# Patient Record
Sex: Female | Born: 1970 | Race: White | Hispanic: No | Marital: Married | Smoking: Former smoker
Health system: Southern US, Community
[De-identification: ages and names within clinical notes are randomized; demographics above are authoritative.]

## PROBLEM LIST (undated history)

## (undated) VITALS — BP 126/86 | HR 96 | Temp 97.9°F | Resp 16 | Ht 63.0 in | Wt 132.0 lb

## (undated) DIAGNOSIS — F419 Anxiety disorder, unspecified: Secondary | ICD-10-CM

## (undated) DIAGNOSIS — N979 Female infertility, unspecified: Secondary | ICD-10-CM

## (undated) DIAGNOSIS — E079 Disorder of thyroid, unspecified: Secondary | ICD-10-CM

## (undated) DIAGNOSIS — R12 Heartburn: Secondary | ICD-10-CM

## (undated) DIAGNOSIS — J069 Acute upper respiratory infection, unspecified: Secondary | ICD-10-CM

## (undated) DIAGNOSIS — Z780 Asymptomatic menopausal state: Secondary | ICD-10-CM

## (undated) DIAGNOSIS — F32A Depression, unspecified: Secondary | ICD-10-CM

## (undated) DIAGNOSIS — M2550 Pain in unspecified joint: Secondary | ICD-10-CM

## (undated) DIAGNOSIS — R600 Localized edema: Secondary | ICD-10-CM

## (undated) DIAGNOSIS — F988 Other specified behavioral and emotional disorders with onset usually occurring in childhood and adolescence: Secondary | ICD-10-CM

## (undated) DIAGNOSIS — K5900 Constipation, unspecified: Secondary | ICD-10-CM

## (undated) DIAGNOSIS — I10 Essential (primary) hypertension: Secondary | ICD-10-CM

## (undated) DIAGNOSIS — R0602 Shortness of breath: Secondary | ICD-10-CM

## (undated) DIAGNOSIS — Z8489 Family history of other specified conditions: Secondary | ICD-10-CM

## (undated) DIAGNOSIS — F329 Major depressive disorder, single episode, unspecified: Secondary | ICD-10-CM

## (undated) DIAGNOSIS — M549 Dorsalgia, unspecified: Secondary | ICD-10-CM

## (undated) DIAGNOSIS — K829 Disease of gallbladder, unspecified: Secondary | ICD-10-CM

## (undated) HISTORY — DX: Essential (primary) hypertension: I10

## (undated) HISTORY — DX: Localized edema: R60.0

## (undated) HISTORY — DX: Female infertility, unspecified: N97.9

## (undated) HISTORY — DX: Disease of gallbladder, unspecified: K82.9

## (undated) HISTORY — DX: Asymptomatic menopausal state: Z78.0

## (undated) HISTORY — DX: Other specified behavioral and emotional disorders with onset usually occurring in childhood and adolescence: F98.8

## (undated) HISTORY — PX: UTERINE FIBROID SURGERY: SHX826

## (undated) HISTORY — PX: TONSILLECTOMY: SUR1361

## (undated) HISTORY — DX: Disorder of thyroid, unspecified: E07.9

## (undated) HISTORY — DX: Shortness of breath: R06.02

## (undated) HISTORY — DX: Dorsalgia, unspecified: M54.9

## (undated) HISTORY — DX: Heartburn: R12

## (undated) HISTORY — DX: Pain in unspecified joint: M25.50

## (undated) HISTORY — DX: Constipation, unspecified: K59.00

---

## 1998-04-27 ENCOUNTER — Other Ambulatory Visit: Admission: RE | Admit: 1998-04-27 | Discharge: 1998-04-27 | Payer: Self-pay | Admitting: *Deleted

## 1998-09-18 ENCOUNTER — Other Ambulatory Visit: Admission: RE | Admit: 1998-09-18 | Discharge: 1998-09-18 | Payer: Self-pay | Admitting: Obstetrics and Gynecology

## 1999-10-03 ENCOUNTER — Other Ambulatory Visit: Admission: RE | Admit: 1999-10-03 | Discharge: 1999-10-03 | Payer: Self-pay | Admitting: Obstetrics and Gynecology

## 2000-12-28 ENCOUNTER — Other Ambulatory Visit: Admission: RE | Admit: 2000-12-28 | Discharge: 2000-12-28 | Payer: Self-pay | Admitting: Obstetrics and Gynecology

## 2002-04-05 ENCOUNTER — Other Ambulatory Visit: Admission: RE | Admit: 2002-04-05 | Discharge: 2002-04-05 | Payer: Self-pay | Admitting: *Deleted

## 2003-05-05 ENCOUNTER — Other Ambulatory Visit: Admission: RE | Admit: 2003-05-05 | Discharge: 2003-05-05 | Payer: Self-pay | Admitting: Obstetrics and Gynecology

## 2003-12-26 ENCOUNTER — Other Ambulatory Visit: Admission: RE | Admit: 2003-12-26 | Discharge: 2003-12-26 | Payer: Self-pay | Admitting: Obstetrics and Gynecology

## 2004-05-07 ENCOUNTER — Other Ambulatory Visit: Admission: RE | Admit: 2004-05-07 | Discharge: 2004-05-07 | Payer: Self-pay | Admitting: Obstetrics and Gynecology

## 2005-05-26 ENCOUNTER — Other Ambulatory Visit: Admission: RE | Admit: 2005-05-26 | Discharge: 2005-05-26 | Payer: Self-pay | Admitting: Obstetrics and Gynecology

## 2005-07-04 ENCOUNTER — Encounter: Admission: RE | Admit: 2005-07-04 | Discharge: 2005-07-04 | Payer: Self-pay | Admitting: Family Medicine

## 2010-07-02 ENCOUNTER — Encounter: Admission: RE | Admit: 2010-07-02 | Discharge: 2010-07-02 | Payer: Self-pay | Admitting: Obstetrics & Gynecology

## 2011-02-17 ENCOUNTER — Emergency Department: Payer: Self-pay | Admitting: Emergency Medicine

## 2011-05-05 ENCOUNTER — Other Ambulatory Visit: Payer: Self-pay | Admitting: Family Medicine

## 2011-05-05 DIAGNOSIS — R3915 Urgency of urination: Secondary | ICD-10-CM

## 2011-05-06 ENCOUNTER — Ambulatory Visit
Admission: RE | Admit: 2011-05-06 | Discharge: 2011-05-06 | Disposition: A | Discharge status: Home / Self Care | Payer: 59 | Source: Ambulatory Visit | Attending: Family Medicine | Admitting: Family Medicine

## 2011-05-06 DIAGNOSIS — R3915 Urgency of urination: Secondary | ICD-10-CM

## 2011-05-16 ENCOUNTER — Encounter (HOSPITAL_COMMUNITY): Payer: Self-pay

## 2011-05-16 ENCOUNTER — Encounter (HOSPITAL_COMMUNITY)
Admission: RE | Admit: 2011-05-16 | Discharge: 2011-05-16 | Disposition: A | Discharge status: Home / Self Care | Payer: 59 | Source: Ambulatory Visit | Attending: Obstetrics & Gynecology | Admitting: Obstetrics & Gynecology

## 2011-05-16 HISTORY — DX: Major depressive disorder, single episode, unspecified: F32.9

## 2011-05-16 HISTORY — DX: Anxiety disorder, unspecified: F41.9

## 2011-05-16 HISTORY — DX: Acute upper respiratory infection, unspecified: J06.9

## 2011-05-16 HISTORY — DX: Depression, unspecified: F32.A

## 2011-05-16 LAB — CBC
HCT: 40.6 % (ref 36.0–46.0)
Hemoglobin: 13.9 g/dL (ref 12.0–15.0)
MCH: 32.5 pg (ref 26.0–34.0)
MCHC: 34.2 g/dL (ref 30.0–36.0)
MCV: 94.9 fL (ref 78.0–100.0)
Platelets: 318 10*3/uL (ref 150–400)
RBC: 4.28 MIL/uL (ref 3.87–5.11)
RDW: 12.8 % (ref 11.5–15.5)
WBC: 8.5 10*3/uL (ref 4.0–10.5)

## 2011-05-16 LAB — SURGICAL PCR SCREEN
MRSA, PCR: NEGATIVE
Staphylococcus aureus: NEGATIVE

## 2011-05-16 NOTE — Patient Instructions (Signed)
20 Ledell Noss  05/16/2011   Your procedure is scheduled on:  05/21/11  Report to [facility] at 11 AM...Marland KitchenMarland KitchenMain entrance  Call this number if you have problems the morning of surgery: [phone]   Remember:   Do not eat food:After Midnight.  Do not drink clear liquids: 4 Hours before arrival.  Take these medicines the morning of surgery with A SIP OF WATER: none   Do not wear jewelry, make-up or nail polish.  Do not bring valuables to the hospital.  Contacts, dentures or bridgework may not be worn into surgery.  Leave suitcase in the car. After surgery it may be brought to your room.  For patients admitted to the hospital, checkout time is 11:00 AM the day of discharge.   Patients discharged the day of surgery will not be allowed to drive home.  Name and phone number of your driver: Tracey Harries-  Special Instructions: shower x 2 with HIbiclens   Please read over the following fact sheets that you were given: none

## 2011-05-21 ENCOUNTER — Encounter (HOSPITAL_COMMUNITY): Payer: Self-pay | Admitting: Anesthesiology

## 2011-05-21 ENCOUNTER — Encounter (HOSPITAL_COMMUNITY)
Admission: RE | Disposition: A | Discharge status: Home / Self Care | Payer: Self-pay | Source: Ambulatory Visit | Attending: Obstetrics & Gynecology

## 2011-05-21 ENCOUNTER — Ambulatory Visit (HOSPITAL_COMMUNITY)
Admission: RE | Admit: 2011-05-21 | Discharge: 2011-05-22 | Disposition: A | Discharge status: Home / Self Care | Payer: 59 | Source: Ambulatory Visit | Attending: Obstetrics & Gynecology | Admitting: Obstetrics & Gynecology

## 2011-05-21 ENCOUNTER — Ambulatory Visit (HOSPITAL_COMMUNITY): Payer: 59 | Admitting: Anesthesiology

## 2011-05-21 ENCOUNTER — Other Ambulatory Visit: Payer: Self-pay | Admitting: Obstetrics & Gynecology

## 2011-05-21 DIAGNOSIS — D251 Intramural leiomyoma of uterus: Secondary | ICD-10-CM | POA: Insufficient documentation

## 2011-05-21 DIAGNOSIS — Z01812 Encounter for preprocedural laboratory examination: Secondary | ICD-10-CM | POA: Insufficient documentation

## 2011-05-21 DIAGNOSIS — Z01818 Encounter for other preprocedural examination: Secondary | ICD-10-CM | POA: Insufficient documentation

## 2011-05-21 HISTORY — PX: ROBOT ASSISTED MYOMECTOMY: SHX5142

## 2011-05-21 LAB — ABO/RH: ABO/RH(D): O POS

## 2011-05-21 LAB — TYPE AND SCREEN
ABO/RH(D): O POS
Antibody Screen: NEGATIVE

## 2011-05-21 LAB — PREGNANCY, URINE: Preg Test, Ur: NEGATIVE

## 2011-05-21 SURGERY — ROBOTIC ASSISTED MYOMECTOMY
Anesthesia: General | Site: Uterus | Wound class: Clean Contaminated

## 2011-05-21 MED ORDER — KETOROLAC TROMETHAMINE 60 MG/2ML IM SOLN
INTRAMUSCULAR | Status: DC | PRN
  Administered 2011-05-21: 30 mg via INTRAMUSCULAR

## 2011-05-21 MED ORDER — DEXTROSE 5 % IV SOLN: 1.0000 g | INTRAVENOUS | Status: DC

## 2011-05-21 MED ORDER — LIDOCAINE HCL (CARDIAC) 10 MG/ML IV SOLN
INTRAVENOUS | Status: DC | PRN
  Administered 2011-05-21: 80 mg via INTRAVENOUS

## 2011-05-21 MED ORDER — ONDANSETRON HCL 4 MG/2ML IJ SOLN
INTRAMUSCULAR | Status: AC
  Filled 2011-05-21: qty 2

## 2011-05-21 MED ORDER — HYDROMORPHONE HCL 1 MG/ML IJ SOLN
0.2000 mg | INTRAMUSCULAR | Status: DC | PRN
  Administered 2011-05-21 – 2011-05-22 (×2): 0.6 mg via INTRAVENOUS
  Filled 2011-05-21 (×2): qty 1

## 2011-05-21 MED ORDER — MIDAZOLAM HCL 2 MG/2ML IJ SOLN
INTRAMUSCULAR | Status: AC
  Filled 2011-05-21: qty 2

## 2011-05-21 MED ORDER — LIDOCAINE HCL (CARDIAC) 20 MG/ML IV SOLN
INTRAVENOUS | Status: AC
  Filled 2011-05-21: qty 5

## 2011-05-21 MED ORDER — GLYCOPYRROLATE 0.2 MG/ML IJ SOLN
INTRAMUSCULAR | Status: DC | PRN
  Administered 2011-05-21: .4 mg via INTRAVENOUS
  Administered 2011-05-21: .2 mg via INTRAVENOUS

## 2011-05-21 MED ORDER — FENTANYL CITRATE 0.05 MG/ML IJ SOLN
INTRAMUSCULAR | Status: AC
  Filled 2011-05-21: qty 2

## 2011-05-21 MED ORDER — FENTANYL CITRATE 0.05 MG/ML IJ SOLN
INTRAMUSCULAR | Status: DC | PRN
  Administered 2011-05-21 (×4): 50 ug via INTRAVENOUS
  Administered 2011-05-21: 100 ug via INTRAVENOUS
  Administered 2011-05-21: 50 ug via INTRAVENOUS

## 2011-05-21 MED ORDER — NEOSTIGMINE METHYLSULFATE 1 MG/ML IJ SOLN
INTRAMUSCULAR | Status: DC | PRN
  Administered 2011-05-21: 2.5 mg via INTRAMUSCULAR

## 2011-05-21 MED ORDER — KETOROLAC TROMETHAMINE 30 MG/ML IJ SOLN: 30.0000 mg | Freq: Four times a day (QID) | INTRAMUSCULAR | Status: DC

## 2011-05-21 MED ORDER — LORATADINE-PSEUDOEPHEDRINE ER 5-120 MG PO TB12: 1.0000 | ORAL_TABLET | Freq: Two times a day (BID) | ORAL | Status: DC

## 2011-05-21 MED ORDER — MIDAZOLAM HCL 5 MG/5ML IJ SOLN
INTRAMUSCULAR | Status: DC | PRN
  Administered 2011-05-21: 2 mg via INTRAVENOUS

## 2011-05-21 MED ORDER — IBUPROFEN 600 MG PO TABS
600.0000 mg | ORAL_TABLET | Freq: Four times a day (QID) | ORAL | Status: DC | PRN
  Administered 2011-05-22: 600 mg via ORAL
  Filled 2011-05-21: qty 1

## 2011-05-21 MED ORDER — BUPIVACAINE HCL (PF) 0.25 % IJ SOLN
INTRAMUSCULAR | Status: DC | PRN
  Administered 2011-05-21: 14 mL

## 2011-05-21 MED ORDER — PROMETHAZINE HCL 25 MG/ML IJ SOLN: 12.5000 mg | INTRAMUSCULAR | Status: DC | PRN

## 2011-05-21 MED ORDER — HYDROMORPHONE HCL 1 MG/ML IJ SOLN
INTRAMUSCULAR | Status: DC | PRN
  Administered 2011-05-21: 1 mg via INTRAVENOUS

## 2011-05-21 MED ORDER — OXYCODONE-ACETAMINOPHEN 5-325 MG PO TABS: 1.0000 | ORAL_TABLET | ORAL | Status: DC | PRN

## 2011-05-21 MED ORDER — SIMETHICONE 80 MG PO CHEW: 80.0000 mg | CHEWABLE_TABLET | Freq: Four times a day (QID) | ORAL | Status: DC | PRN

## 2011-05-21 MED ORDER — ROCURONIUM BROMIDE 50 MG/5ML IV SOLN
INTRAVENOUS | Status: AC
  Filled 2011-05-21: qty 1

## 2011-05-21 MED ORDER — RINGERS IRRIGATION IR SOLN
Status: DC | PRN
  Administered 2011-05-21: 1

## 2011-05-21 MED ORDER — FENTANYL CITRATE 0.05 MG/ML IJ SOLN
INTRAMUSCULAR | Status: AC
  Filled 2011-05-21: qty 10

## 2011-05-21 MED ORDER — GLYCOPYRROLATE 0.2 MG/ML IJ SOLN
INTRAMUSCULAR | Status: AC
  Filled 2011-05-21: qty 1

## 2011-05-21 MED ORDER — ROCURONIUM BROMIDE 100 MG/10ML IV SOLN
INTRAVENOUS | Status: DC | PRN
  Administered 2011-05-21: 45 mg via INTRAVENOUS
  Administered 2011-05-21: 20 mg via INTRAVENOUS
  Administered 2011-05-21: 5 mg via INTRAVENOUS

## 2011-05-21 MED ORDER — LACTATED RINGERS IV SOLN
INTRAVENOUS | Status: DC
  Administered 2011-05-22: 02:00:00 via INTRAVENOUS

## 2011-05-21 MED ORDER — HYDROMORPHONE HCL 1 MG/ML IJ SOLN
0.5000 mg | INTRAMUSCULAR | Status: DC | PRN
  Administered 2011-05-21 (×2): 0.5 mg via INTRAVENOUS

## 2011-05-21 MED ORDER — HYDROMORPHONE HCL 1 MG/ML IJ SOLN
INTRAMUSCULAR | Status: AC
  Administered 2011-05-21: 0.5 mg via INTRAVENOUS
  Filled 2011-05-21: qty 1

## 2011-05-21 MED ORDER — KETOROLAC TROMETHAMINE 30 MG/ML IJ SOLN
30.0000 mg | Freq: Four times a day (QID) | INTRAMUSCULAR | Status: DC
  Administered 2011-05-21 – 2011-05-22 (×2): 30 mg via INTRAVENOUS
  Filled 2011-05-21 (×2): qty 1

## 2011-05-21 MED ORDER — DEXTROSE 5 % IV SOLN
1.0000 g | INTRAVENOUS | Status: DC
  Filled 2011-05-21: qty 1

## 2011-05-21 MED ORDER — DEXAMETHASONE SODIUM PHOSPHATE 10 MG/ML IJ SOLN
INTRAMUSCULAR | Status: DC | PRN
  Administered 2011-05-21: 10 mg via INTRAVENOUS

## 2011-05-21 MED ORDER — CITALOPRAM HYDROBROMIDE 20 MG PO TABS
20.0000 mg | ORAL_TABLET | Freq: Every day | ORAL | Status: DC
Start: 2011-05-21 — End: 2011-05-22
  Administered 2011-05-21: 20 mg via ORAL
  Filled 2011-05-21 (×3): qty 1

## 2011-05-21 MED ORDER — PROPOFOL 10 MG/ML IV EMUL
INTRAVENOUS | Status: DC | PRN
  Administered 2011-05-21: 150 mg via INTRAVENOUS

## 2011-05-21 MED ORDER — PSEUDOEPHEDRINE HCL ER 120 MG PO TB12
120.0000 mg | ORAL_TABLET | Freq: Two times a day (BID) | ORAL | Status: DC
  Filled 2011-05-21 (×4): qty 1

## 2011-05-21 MED ORDER — PROPOFOL 10 MG/ML IV EMUL
INTRAVENOUS | Status: AC
  Filled 2011-05-21: qty 20

## 2011-05-21 MED ORDER — KETOROLAC TROMETHAMINE 60 MG/2ML IM SOLN
INTRAMUSCULAR | Status: AC
  Filled 2011-05-21: qty 2

## 2011-05-21 MED ORDER — FENTANYL CITRATE 0.05 MG/ML IJ SOLN
INTRAMUSCULAR | Status: AC
  Administered 2011-05-21: 50 ug via INTRAVENOUS
  Filled 2011-05-21: qty 2

## 2011-05-21 MED ORDER — HYDROMORPHONE HCL 1 MG/ML IJ SOLN
INTRAMUSCULAR | Status: AC
  Filled 2011-05-21: qty 1

## 2011-05-21 MED ORDER — DEXAMETHASONE SODIUM PHOSPHATE 10 MG/ML IJ SOLN
INTRAMUSCULAR | Status: AC
  Filled 2011-05-21: qty 1

## 2011-05-21 MED ORDER — SUCCINYLCHOLINE CHLORIDE 20 MG/ML IJ SOLN
INTRAMUSCULAR | Status: DC | PRN
  Administered 2011-05-21: 20 mg via INTRAVENOUS

## 2011-05-21 MED ORDER — LORATADINE 10 MG PO TABS
10.0000 mg | ORAL_TABLET | Freq: Every day | ORAL | Status: DC
  Filled 2011-05-21 (×3): qty 1

## 2011-05-21 MED ORDER — LACTATED RINGERS IV SOLN
INTRAVENOUS | Status: DC
  Administered 2011-05-21 (×5): via INTRAVENOUS

## 2011-05-21 MED ORDER — VASOPRESSIN 20 UNIT/ML IJ SOLN
INTRAVENOUS | Status: DC | PRN
  Administered 2011-05-21: 16:00:00 via INTRAMUSCULAR

## 2011-05-21 MED ORDER — DEXTROSE 5 % IV SOLN
1.0000 g | INTRAVENOUS | Status: DC | PRN
  Administered 2011-05-21: 1 g via INTRAVENOUS

## 2011-05-21 MED ORDER — FENTANYL CITRATE 0.05 MG/ML IJ SOLN
25.0000 ug | INTRAMUSCULAR | Status: DC | PRN
  Administered 2011-05-21 (×2): 50 ug via INTRAVENOUS

## 2011-05-21 MED ORDER — ONDANSETRON HCL 4 MG/2ML IJ SOLN
INTRAMUSCULAR | Status: DC | PRN
  Administered 2011-05-21: 4 mg via INTRAVENOUS

## 2011-05-21 SURGICAL SUPPLY — 63 items
BARRIER ADHS 3X4 INTERCEED (GAUZE/BANDAGES/DRESSINGS) ×2 IMPLANT
BLADE LAPAROSCOPIC MORCELL KIT (BLADE) ×2 IMPLANT
BRR ADH 4X3 ABS CNTRL BYND (GAUZE/BANDAGES/DRESSINGS) ×1
CANNULA SEAL DVNC (CANNULA) ×3 IMPLANT
CANNULA SEALS DA VINCI (CANNULA) ×3
CHLORAPREP W/TINT 26ML (MISCELLANEOUS) ×2 IMPLANT
CLOTH BEACON ORANGE TIMEOUT ST (SAFETY) ×2 IMPLANT
DECANTER SPIKE VIAL GLASS SM (MISCELLANEOUS) ×2 IMPLANT
DERMABOND ADVANCED (GAUZE/BANDAGES/DRESSINGS) ×5 IMPLANT
DRAPE CAMERA ARM DA VINCI (DRAPE) ×2 IMPLANT
DRAPE CAMERA DA VINCI (DRAPES) ×2 IMPLANT
DRAPE HUG U DISPOSABLE (DRAPE) ×2 IMPLANT
DRAPE HYSTEROSCOPY (DRAPE) ×2 IMPLANT
DRAPE INSTRUMENT ARM DA VINCI (DRAPES) ×1
DRAPE INSTRUMENT ARM DVNC (DRAPES) ×1 IMPLANT
DRAPE LG THREE QUARTER DISP (DRAPES) ×4 IMPLANT
DRAPE MONITOR DA VINCI (DRAPE) ×2 IMPLANT
DRAPE UTILITY XL STRL (DRAPES) ×2 IMPLANT
DRAPE WARM FLUID 44X44 (DRAPE) ×2 IMPLANT
ELECT REM PT RETURN 9FT ADLT (ELECTROSURGICAL) ×2
ELECTRODE REM PT RTRN 9FT ADLT (ELECTROSURGICAL) ×1 IMPLANT
EVACUATOR SMOKE 8.L (FILTER) ×2 IMPLANT
GAUZE VASELINE 3X9 (GAUZE/BANDAGES/DRESSINGS) IMPLANT
GLOVE BIO SURGEON STRL SZ 6.5 (GLOVE) ×4 IMPLANT
GLOVE ECLIPSE 6.5 STRL STRAW (GLOVE) ×6 IMPLANT
GOWN BRE IMP SLV AUR LG STRL (GOWN DISPOSABLE) ×8 IMPLANT
IV SET ADMIN PUMP GEMINI W/NLD (IV SETS) IMPLANT
IV STOPCOCK 4 WAY 40  W/Y SET (IV SOLUTION) ×1
IV STOPCOCK 4 WAY 40 W/Y SET (IV SOLUTION) ×1 IMPLANT
KIT DISP ACCESSORY 4 ARM (KITS) ×2 IMPLANT
NDL INSUFFLATION 14GA 120MM (NEEDLE) ×1 IMPLANT
NDL SPNL 22GX7 QUINCKE BK (NEEDLE) IMPLANT
NEEDLE HYPO 22GX1.5 SAFETY (NEEDLE) ×2 IMPLANT
NEEDLE INSUFFLATION 14GA 120MM (NEEDLE) ×2 IMPLANT
NEEDLE SPNL 22GX7 QUINCKE BK (NEEDLE) ×2 IMPLANT
NS IRRIG 1000ML POUR BTL (IV SOLUTION) ×6 IMPLANT
OCCLUDER COLPOPNEUMO (BALLOONS) IMPLANT
PACK LAVH (CUSTOM PROCEDURE TRAY) ×2 IMPLANT
POSITIONER SURGICAL ARM (MISCELLANEOUS) ×4 IMPLANT
SET IRRIG TUBING LAPAROSCOPIC (IRRIGATION / IRRIGATOR) ×2 IMPLANT
SOLUTION ELECTROLUBE (MISCELLANEOUS) ×2 IMPLANT
SPONGE LAP 18X18 X RAY DECT (DISPOSABLE) IMPLANT
SUT VIC AB 0 CT1 27 (SUTURE) ×12
SUT VIC AB 0 CT1 27XBRD ANTBC (SUTURE) IMPLANT
SUT VIC AB 2-0 CT2 27 (SUTURE) ×2 IMPLANT
SUT VIC AB 4-0 PS2 27 (SUTURE) ×2 IMPLANT
SUT VICRYL 0 UR6 27IN ABS (SUTURE) ×4 IMPLANT
SYR 50ML LL SCALE MARK (SYRINGE) ×2 IMPLANT
SYR 50ML SLIP (SYRINGE) ×2 IMPLANT
SYSTEM CONVERTIBLE TROCAR (TROCAR) ×2 IMPLANT
TIP UTERINE 5.1X6CM LAV DISP (MISCELLANEOUS) IMPLANT
TIP UTERINE 6.7X10CM GRN DISP (MISCELLANEOUS) IMPLANT
TIP UTERINE 6.7X6CM WHT DISP (MISCELLANEOUS) IMPLANT
TIP UTERINE 6.7X8CM BLUE DISP (MISCELLANEOUS) IMPLANT
TOWEL OR 17X24 6PK STRL BLUE (TOWEL DISPOSABLE) ×4 IMPLANT
TRAY FOLEY BAG SILVER LF 14FR (CATHETERS) ×2 IMPLANT
TROCAR 12M 150ML BLUNT (TROCAR) IMPLANT
TROCAR DISP BLADELESS 8 DVNC (TROCAR) ×1 IMPLANT
TROCAR DISP BLADELESS 8MM (TROCAR) ×1
TROCAR KII 12MM C0R66 BLD (TROCAR) IMPLANT
TROCAR XCEL 12X100 BLDLESS (ENDOMECHANICALS) ×2 IMPLANT
TROCAR Z-THREAD BLADED 12X100M (TROCAR) ×2 IMPLANT
TUBING FILTER THERMOFLATOR (ELECTROSURGICAL) ×2 IMPLANT

## 2011-05-21 NOTE — Anesthesia Preprocedure Evaluation (Addendum)
Anesthesia Evaluation  Name, MR# and DOB Patient awake  General Assessment Comment  Reviewed: Allergy & Precautions, H&P , Patient's Chart, lab work & pertinent test results and reviewed documented beta blocker date and time   Airway Mallampati: II TM Distance: >3 FB Neck ROM: full    Dental No notable dental hx    Pulmonaryneg pulmonary ROS  Asthma: No inhaler, last sx 10 yrs ago.  clear to auscultation  pulmonary exam normal   Cardiovascular regular Normal   Neuro/PsychNegative Neurological ROS Negative Psych ROS  GI/Hepatic/Renal negative GI ROS, negative Liver ROS, and negative Renal ROS (+)       Endo/Other  Negative Endocrine ROS (+)   Abdominal   Musculoskeletal  Hematology negative hematology ROS (+)   Peds  Reproductive/Obstetrics   Anesthesia Other Findings             Anesthesia Physical Anesthesia Plan  ASA: I  Anesthesia Plan: General   Post-op Pain Management:    Induction: Intravenous  Airway Management Planned: Oral ETT  Additional Equipment:   Intra-op Plan:   Post-operative Plan:   Informed Consent: I have reviewed the patients History and Physical, chart, labs and discussed the procedure including the risks, benefits and alternatives for the proposed anesthesia with the patient or authorized representative who has indicated his/her understanding and acceptance.   Dental Advisory Given  Plan Discussed with: CRNA and Surgeon  Anesthesia Plan Comments: (13/40  Discussed  general anesthesia, including possible nausea, instrumentation of airway, sore throat,pulmonary aspiration, etc. I asked if the were any outstanding questions, or  concerns before we proceeded. )      Anesthesia Quick Evaluation

## 2011-05-21 NOTE — H&P (Signed)
Sonya Novak is an 40 y.o. 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  Pertinent Gynecological History: Menses: flow is moderate Bleeding: no abnormal bleeding Contraception: condoms Blood transfusions: none Sexually transmitted diseases: no past history Previous GYN Procedures: none  Last pap: normal OB History: G0  Menstrual History:  Patient's last menstrual period was 05/15/2011.    Past Medical History  Diagnosis Date  . Asthma     related to allergies- occ inhaler use  . Anxiety   . Depression   . Recurrent upper respiratory infection (URI)     allergy related    Past Surgical History  Procedure Date  . Tonsillectomy     No family history on file.  Social History:  reports that she has quit smoking. She does not have any smokeless tobacco history on file. She reports that she drinks about .6 ounces of alcohol per week. She reports that she does not use illicit drugs.  Allergies: No Known Allergies  Prescriptions prior to admission  Medication Sig Dispense Refill  . citalopram (CELEXA) 20 MG tablet Take 20 mg by mouth daily.        Marland Kitchen loratadine-pseudoephedrine (CLARITIN-D 12-HOUR) 5-120 MG per tablet Take 1 tablet by mouth 2 (two) times daily.           Blood pressure 126/72, pulse 83, temperature 99.3 F (37.4 C), temperature source Oral, resp. rate 18, last menstrual period 05/15/2011, SpO2 97.00%. RCR Lungs clear VE post myoma 9+ cm, ut. Mobile, no adnexal mass  Results for orders placed during the hospital encounter of 05/21/11 (from the past 24 hour(s))  TYPE AND SCREEN     Status: Normal   Collection Time   05/21/11 11:11 AM      Component Value Range   ABO/RH(D) O POS     Antibody Screen NEG     Sample Expiration 05/24/2011    PREGNANCY, URINE     Status: Normal   Collection Time   05/21/11 11:19 AM      Component Value Range   Preg Test, Ur NEGATIVE      No results found.   Pelvic US 05/06/11  Post IM myoma 9.3 cm.  Otherwise neg [place].  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  Desire to preserve fertility.  Da Vinci myomectomy.  Morcellation.  Surg. And risks reviewed.  Hinley Brimage,MARIE-LYNE 05/21/2011, 1:07 PM

## 2011-05-21 NOTE — Brief Op Note (Signed)
05/21/2011  6:10 PM  SURGEON:  Surgeon(s): Marie-Lyne Steva Ready A. Fogleman  See Op. Note already written. Genia Del MD

## 2011-05-21 NOTE — Anesthesia Postprocedure Evaluation (Signed)
  Anesthesia Post-op Note  Patient: Sonya Novak  Procedure(s) Performed:  ROBOTIC ASSISTED MYOMECTOMY  Patient Location: PACU  Anesthesia Type: General  Level of Consciousness: awake, alert  and oriented  Airway and Oxygen Therapy: Patient Spontanous Breathing  Post-op Pain: mild  Post-op Assessment: Post-op Vital signs reviewed, Patient's Cardiovascular Status Stable, Respiratory Function Stable, Patent Airway, No signs of Nausea or vomiting and Pain level controlled  Post-op Vital Signs: Reviewed and stable  Complications: No apparent anesthesia complications

## 2011-05-21 NOTE — Transfer of Care (Signed)
Immediate Anesthesia Transfer of Care Note  Patient: Sonya Novak  Procedure(s) Performed:  ROBOTIC ASSISTED MYOMECTOMY  Patient Location: PACU  Anesthesia Type: General  Level of Consciousness: awake, alert  and oriented  Airway & Oxygen Therapy: Patient Spontanous Breathing and Patient connected to nasal cannula oxygen  Post-op Assessment: Report given to PACU RN  Post vital signs: Reviewed and stable  Complications: No apparent anesthesia complications

## 2011-05-21 NOTE — Op Note (Signed)
05/21/2011  6:06 PM  PATIENT:  Sonya Novak  40 y.o. female  PRE-OPERATIVE DIAGNOSIS:  Uterine Fibroid  POST-OPERATIVE DIAGNOSIS:  Same  PROCEDURE:  Procedure(s): ROBOTIC ASSISTED MYOMECTOMY, morcellation  SURGEON:  Marie-Lyne Haniel Fix   ASSISTANTS: Noland Fordyce  ANESTHESIA:   general with ETI  Procedure: The patient is under general and if the eye with endotracheal intubation. She is prepped with Betadine on the abdominal suprapubic vulvar and vaginal areas. She is draped as usual.  A weighted speculum is inserted in the vagina, the anterior lip of the cervix is grasped with a tenaculum. The hysterometry is at 11 cm.  Dilatation of the cervix ad Hegar dilator #27. The roomy #10 with a Koh ring medium-size are put in place easily. Abdominally, the subcutaneous tissue was infiltrated with Marcaine one quarter plain at the supraumbilical area. A 1.5 cm incision was done with a scalpel. The aponeurosis was opened under direct vision with the Mayo scissors. And the parietal peritoneum was opened bluntly with a finger. A pursestring stitch of Vicryl 0 was applied on the aponeurosis. The Roseanne Reno was inserted at that level. And the camera was inserted for inspection of the abdomino-pelvic cavities. No adhesion was present with the anterior wall. An M. configuration was used for the port placement. Marcaine one quarter plain was infiltrated at all sites and incisions were done with the scalpel. 3 robotic ports were inserted under direct vision, 2 on the right and one on the left. The 5 mm assistant port was inserted on the upper left side. The patient was then put in deep Trendelenburg. And the robot was docked easily on the right side. The Endo Shears scissor was inserted on the first arm, the PK on the second arm and the Prograsp on the 3rd arm. At the console, the pelvic inspection was done. Both ovaries were normal in size and appearance. Both tubes were normal in size and appearance with normal  fimbria. The uterus was enlarged with a large posterior intramural myoma. Infiltration of vasopressin transcutaneously into the posterior aspect of the uterus into the myometrium. An incision was done posteriorly in a vertical plane with the the tip of the Endo Shears scissors. We then switched the Prograsp for a tenaculum and the 3rd arm. With traction and countertraction we proceeded with myomectomy. When necessary blood vessels were cauterized. The texture of the myoma was very soft, which makes [place] believe that it is probably an adenomyoma. This made the dissection and myomectomy more difficult. The endometrial cavity was reached but not entered. We then switched instruments to the cutting needle driver in the first arm, the regular needle driver in the second arm and the PK in the 3rd arm. We used Vicryl 0 in figure-of-eight to close the myometrium. 6 stitches were used. We then did a baseball stitch with Vicryl 2-0 to close the serosa. We then removed robotic instruments, undocked the robot and went by laparoscopy. Morcellation of the myoma was achieved with the [facility] morcellator. We then irrigated and suctioned the abdominopelvic cavity. Interceed was applied on the line of suture on the posterior uterus. We then removed all instruments. Evacuated the CO2. All incisions were closed with a subcuticular stitch of Vicryl 4-0. And Dermabond was applied. The patient was transferred to PACU in good and stable state.  ESTIMATED BLOOD LOSS: 500 cc   Intake/Output Summary (Last 24 hours) at 05/21/11 1806 Last data filed at 05/21/11 1745  Gross per 24 hour  Intake   2400  ml  Output   1110 ml  Net   1290 ml     BLOOD ADMINISTERED:none   LOCAL MEDICATIONS USED:  MARCAINE 20 CC  SPECIMEN:  Source of Specimen:  Myoma  DISPOSITION OF SPECIMEN:  PATHOLOGY  COUNTS:  YES    PLAN OF CARE: Transfer to PACU

## 2011-05-22 LAB — CBC
HCT: 32.3 % — ABNORMAL LOW (ref 36.0–46.0)
Hemoglobin: 11.2 g/dL — ABNORMAL LOW (ref 12.0–15.0)
MCH: 32.9 pg (ref 26.0–34.0)
MCHC: 34.7 g/dL (ref 30.0–36.0)
MCV: 95 fL (ref 78.0–100.0)
Platelets: 268 10*3/uL (ref 150–400)
RBC: 3.4 MIL/uL — ABNORMAL LOW (ref 3.87–5.11)
RDW: 12.9 % (ref 11.5–15.5)
WBC: 13.2 10*3/uL — ABNORMAL HIGH (ref 4.0–10.5)

## 2011-05-22 MED ORDER — OXYCODONE-ACETAMINOPHEN 7.5-325 MG PO TABS: 1.0000 | ORAL_TABLET | ORAL | Status: AC | PRN

## 2011-05-22 MED ORDER — SODIUM CHLORIDE 0.9 % IJ SOLN
3.0000 mL | Freq: Two times a day (BID) | INTRAMUSCULAR | Status: DC
  Administered 2011-05-22: 3 mL via INTRAVENOUS

## 2011-05-22 NOTE — Progress Notes (Signed)
Postop #1  05/22/2011  S:  Pt reports feeling well/ Tolerating po/ Voiding without problems/ No n/v/ Vag. bleeding is mild/ Pain controlled with ibuprofen (OTC)     O:  A & O x 3 Filed Vitals:   05/22/11 1400  BP: 109/68  Pulse: 67  Temp: 98.3 F (36.8 C)  Resp: 18    LABS:   Post op CBC pending from today.  Lab Results  Component Value Date   WBC 8.5 05/16/2011   HGB 13.9 05/16/2011   HCT 40.6 05/16/2011   MCV 94.9 05/16/2011   PLT 318 05/16/2011      Lungs: clear  Heart: rcr  Abdomen: soft, BS pos, incisions intact  Extremities: normal    A/P: PO #1  Doing well, hemodynamically stable, afebrile  Surgery and findings reviewed.  PO recommendations discussed.  Percocet generic Rx PRN/Ibuprofen PRN             Patho pending.  D/C home, f/u with me 3 wks.  Genia Del MD  05/22/2011 at 14:59

## 2011-05-30 ENCOUNTER — Other Ambulatory Visit: Payer: Self-pay | Admitting: Obstetrics & Gynecology

## 2011-05-30 DIAGNOSIS — Z1231 Encounter for screening mammogram for malignant neoplasm of breast: Secondary | ICD-10-CM

## 2011-06-11 ENCOUNTER — Encounter (HOSPITAL_COMMUNITY): Payer: Self-pay | Admitting: Obstetrics & Gynecology

## 2011-07-04 ENCOUNTER — Ambulatory Visit: Payer: 59

## 2013-05-04 ENCOUNTER — Other Ambulatory Visit: Payer: Self-pay | Admitting: Family Medicine

## 2013-05-04 DIAGNOSIS — R350 Frequency of micturition: Secondary | ICD-10-CM

## 2013-05-05 ENCOUNTER — Ambulatory Visit
Admission: RE | Admit: 2013-05-05 | Discharge: 2013-05-05 | Disposition: A | Discharge status: Home / Self Care | Payer: BC Managed Care – PPO | Source: Ambulatory Visit | Attending: Family Medicine | Admitting: Family Medicine

## 2013-05-05 DIAGNOSIS — R350 Frequency of micturition: Secondary | ICD-10-CM

## 2013-05-10 ENCOUNTER — Other Ambulatory Visit: Payer: Self-pay | Admitting: Family Medicine

## 2013-05-10 DIAGNOSIS — R1900 Intra-abdominal and pelvic swelling, mass and lump, unspecified site: Secondary | ICD-10-CM

## 2013-05-14 ENCOUNTER — Ambulatory Visit
Admission: RE | Admit: 2013-05-14 | Discharge: 2013-05-14 | Disposition: A | Discharge status: Home / Self Care | Payer: BC Managed Care – PPO | Source: Ambulatory Visit | Attending: Family Medicine | Admitting: Family Medicine

## 2013-05-14 DIAGNOSIS — R1900 Intra-abdominal and pelvic swelling, mass and lump, unspecified site: Secondary | ICD-10-CM

## 2013-05-14 MED ORDER — GADOBENATE DIMEGLUMINE 529 MG/ML IV SOLN
14.0000 mL | Freq: Once | INTRAVENOUS | Status: AC | PRN
  Administered 2013-05-14: 14 mL via INTRAVENOUS

## 2013-09-13 ENCOUNTER — Encounter (INDEPENDENT_AMBULATORY_CARE_PROVIDER_SITE_OTHER): Payer: Self-pay

## 2013-09-13 ENCOUNTER — Inpatient Hospital Stay (HOSPITAL_COMMUNITY)
Admission: RE | Admit: 2013-09-13 | Discharge: 2013-09-16 | DRG: 885 | Disposition: A | Discharge status: Home / Self Care | Payer: BC Managed Care – PPO | Attending: Psychiatry | Admitting: Psychiatry

## 2013-09-13 ENCOUNTER — Ambulatory Visit (INDEPENDENT_AMBULATORY_CARE_PROVIDER_SITE_OTHER): Payer: BC Managed Care – PPO | Admitting: Psychiatry

## 2013-09-13 ENCOUNTER — Encounter (HOSPITAL_COMMUNITY): Payer: Self-pay | Admitting: *Deleted

## 2013-09-13 VITALS — BP 134/87 | HR 113 | Ht 63.0 in | Wt 132.4 lb

## 2013-09-13 DIAGNOSIS — R45851 Suicidal ideations: Secondary | ICD-10-CM

## 2013-09-13 DIAGNOSIS — F4323 Adjustment disorder with mixed anxiety and depressed mood: Secondary | ICD-10-CM | POA: Diagnosis present

## 2013-09-13 DIAGNOSIS — J45909 Unspecified asthma, uncomplicated: Secondary | ICD-10-CM | POA: Diagnosis present

## 2013-09-13 DIAGNOSIS — Z634 Disappearance and death of family member: Secondary | ICD-10-CM

## 2013-09-13 DIAGNOSIS — F332 Major depressive disorder, recurrent severe without psychotic features: Secondary | ICD-10-CM

## 2013-09-13 DIAGNOSIS — F411 Generalized anxiety disorder: Secondary | ICD-10-CM | POA: Diagnosis present

## 2013-09-13 DIAGNOSIS — Z79899 Other long term (current) drug therapy: Secondary | ICD-10-CM

## 2013-09-13 DIAGNOSIS — F909 Attention-deficit hyperactivity disorder, unspecified type: Secondary | ICD-10-CM

## 2013-09-13 LAB — COMPREHENSIVE METABOLIC PANEL
ALT: 11 U/L (ref 0–35)
AST: 15 U/L (ref 0–37)
Albumin: 4.3 g/dL (ref 3.5–5.2)
Alkaline Phosphatase: 95 U/L (ref 39–117)
BUN: 12 mg/dL (ref 6–23)
CO2: 27 mEq/L (ref 19–32)
Calcium: 10 mg/dL (ref 8.4–10.5)
Chloride: 100 mEq/L (ref 96–112)
Creatinine, Ser: 0.77 mg/dL (ref 0.50–1.10)
GFR calc Af Amer: >90 mL/min (ref >90)
GFR calc non Af Amer: >90 mL/min (ref >90)
Glucose, Bld: 92 mg/dL (ref 70–99)
Potassium: 3.8 mEq/L (ref 3.5–5.1)
Sodium: 137 mEq/L (ref 135–145)
Total Bilirubin: 0.5 mg/dL (ref 0.3–1.2)
Total Protein: 7.5 g/dL (ref 6.0–8.3)

## 2013-09-13 LAB — URINE MICROSCOPIC-ADD ON

## 2013-09-13 LAB — URINALYSIS, ROUTINE W REFLEX MICROSCOPIC
Bilirubin Urine: NEGATIVE
Glucose, UA: NEGATIVE mg/dL
Ketones, ur: 15 mg/dL — AB
Leukocytes, UA: NEGATIVE
Nitrite: NEGATIVE
Protein, ur: NEGATIVE mg/dL
Specific Gravity, Urine: 1.023 (ref 1.005–1.030)
Urobilinogen, UA: 0.2 mg/dL (ref 0.0–1.0)
pH: 5.5 (ref 5.0–8.0)

## 2013-09-13 LAB — CBC
HCT: 42.3 % (ref 36.0–46.0)
Hemoglobin: 14.9 g/dL (ref 12.0–15.0)
MCH: 31.9 pg (ref 26.0–34.0)
MCHC: 35.2 g/dL (ref 30.0–36.0)
MCV: 90.6 fL (ref 78.0–100.0)
Platelets: 384 10*3/uL (ref 150–400)
RBC: 4.67 MIL/uL (ref 3.87–5.11)
RDW: 12.1 % (ref 11.5–15.5)
WBC: 8.9 10*3/uL (ref 4.0–10.5)

## 2013-09-13 LAB — RAPID URINE DRUG SCREEN, HOSP PERFORMED
Amphetamines: POSITIVE — AB
Barbiturates: NOT DETECTED
Benzodiazepines: POSITIVE — AB
Cocaine: NOT DETECTED
Opiates: NOT DETECTED
Tetrahydrocannabinol: NOT DETECTED

## 2013-09-13 LAB — PREGNANCY, URINE: Preg Test, Ur: NEGATIVE

## 2013-09-13 LAB — ETHANOL: Alcohol, Ethyl (B): <11 mg/dL (ref 0–11)

## 2013-09-13 MED ORDER — ALUM & MAG HYDROXIDE-SIMETH 200-200-20 MG/5ML PO SUSP: 30.0000 mL | ORAL | Status: DC | PRN

## 2013-09-13 MED ORDER — MAGNESIUM HYDROXIDE 400 MG/5ML PO SUSP: 30.0000 mL | Freq: Every day | ORAL | Status: DC | PRN

## 2013-09-13 MED ORDER — HYDROXYZINE HCL 25 MG PO TABS
25.0000 mg | ORAL_TABLET | Freq: Four times a day (QID) | ORAL | Status: DC | PRN
  Administered 2013-09-13 – 2013-09-15 (×4): 25 mg via ORAL
  Filled 2013-09-13 (×4): qty 1

## 2013-09-13 MED ORDER — ACETAMINOPHEN 325 MG PO TABS
650.0000 mg | ORAL_TABLET | Freq: Four times a day (QID) | ORAL | Status: DC | PRN
  Administered 2013-09-14: 650 mg via ORAL
  Filled 2013-09-13: qty 2

## 2013-09-13 NOTE — Progress Notes (Signed)
Recreation Therapy Notes  Date: 11.11.2014 Time: 2:45pm Location: [address] Dayroom  Group Topic: Animal Assisted Activities (AAA)  Behavioral Response: Did not attend.   Jolayne Branson L [place], LRT/CTRS  Efe Fazzino L 09/13/2013 5:19 PM 

## 2013-09-13 NOTE — Tx Team (Signed)
Initial Interdisciplinary Treatment Plan  PATIENT STRENGTHS: (choose at least two) Ability for insight Active sense of humor Average or above average intelligence Capable of independent living Communication skills Financial means General fund of knowledge Motivation for treatment/growth Religious Affiliation Special hobby/interest Supportive family/friends  PATIENT STRESSORS: Occupational concerns   PROBLEM LIST: Problem List/Patient Goals Date to be addressed Date deferred Reason deferred Estimated date of resolution  Honest communication      Anger                                                 DISCHARGE CRITERIA:  Ability to meet basic life and health needs Adequate post-discharge living arrangements Improved stabilization in mood, thinking, and/or behavior Reduction of life-threatening or endangering symptoms to within safe limits Verbal commitment to aftercare and medication compliance  PRELIMINARY DISCHARGE PLAN: Outpatient therapy Participate in family therapy Return to previous living arrangement Return to previous work or school arrangements  PATIENT/FAMIILY INVOLVEMENT: This treatment plan has been presented to and reviewed with the patient, Sonya Novak, and/or family member, The patient and family have been given the opportunity to ask questions and make suggestions.  Marchia Bond 09/13/2013, 6:41 PM
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
## 2013-09-14 DIAGNOSIS — R45851 Suicidal ideations: Secondary | ICD-10-CM

## 2013-09-14 DIAGNOSIS — F332 Major depressive disorder, recurrent severe without psychotic features: Secondary | ICD-10-CM | POA: Diagnosis present

## 2013-09-14 DIAGNOSIS — F909 Attention-deficit hyperactivity disorder, unspecified type: Secondary | ICD-10-CM

## 2013-09-14 LAB — TSH: TSH: 3.471 u[IU]/mL (ref 0.350–4.500)

## 2013-09-14 MED ORDER — BUSPIRONE HCL 15 MG PO TABS
7.5000 mg | ORAL_TABLET | Freq: Two times a day (BID) | ORAL | Status: DC
  Administered 2013-09-14 – 2013-09-16 (×4): 7.5 mg via ORAL
  Filled 2013-09-14 (×6): qty 1

## 2013-09-14 MED ORDER — BUPROPION HCL ER (XL) 300 MG PO TB24
300.0000 mg | ORAL_TABLET | Freq: Every day | ORAL | Status: DC
  Administered 2013-09-14 – 2013-09-16 (×3): 300 mg via ORAL
  Filled 2013-09-14 (×6): qty 1

## 2013-09-14 MED ORDER — LORATADINE 10 MG PO TABS: 10.0000 mg | ORAL_TABLET | Freq: Every day | ORAL | Status: DC | PRN

## 2013-09-14 NOTE — Progress Notes (Signed)
Adult Psychoeducational Group Note  Date:  09/14/2013 Time:  10:00 am Group Topic/Focus:  Therapeutic Activity  Participation Level:  Active  Participation Quality:  Appropriate and Attentive  Affect:  Appropriate  Cognitive:  Alert and Appropriate  Insight: Appropriate  Engagement in Group:  Engaged  Modes of Intervention:  Discussion and Education  Additional Comments:  Pt attended and participated in group. Discussion was on naming a happy time in your life. Pt stated the best time of her life is when she is running.  Shelly Bombard D 09/14/2013, 1:48 PM

## 2013-09-14 NOTE — [facility] Group Notes (Signed)
[facility] LCSW Group Therapy  Emotional Regulation 1:15 - 2: 30 PM        09/14/2013    Type of Therapy:  Group Therapy  Participation Level:  Minimal  Participation Quality:  Appropriate  Affect:  Appropriate  Cognitive:  Attentive Appropriate  Insight:  Developing/Improving  Engagement in Therapy:  Developing/Improving  Modes of Intervention:  Discussion Exploration Problem-Solving Supportive  Summary of Progress/Problems:  Group topic was emotional regulations.  Patient listened attentively but did not participate in discussion.  Wynn Banker 09/14/2013

## 2013-09-14 NOTE — Progress Notes (Signed)
Chaplain provided support at pt request.  Met with pt during recreation.  Grief support around death of dog, abortion in 30-Mar-1988 which pt began to recall after loss of dog, not having children as hoped, loss of relationships, stress at work and feeling unable to "say no" and have boundaries.  Pt is [place] and spoke with chaplain about God's role in her losses, need to take medication, ability to care for herself.  Will continue to follow for support.  Please page as needs arise.    Belva Crome MDiv

## 2013-09-14 NOTE — [facility] Group Notes (Signed)
[facility] LCSW Aftercare Discharge Planning Group Note   09/14/2013 1:05 PM    Participation Quality:  Appropraite  Mood/Affect:  Appropriate  Depression Rating:  10  Anxiety Rating:  10  Thoughts of Suicide:  No  Will you contract for safety?   NA  Current AVH:  No  Plan for Discharge/Comments:  Patient attending discharge planning group and actively participated in group.  Patient advised she has not been seen recently and will need a referral for outpatient services.  CSW provided all participants with daily workbook.   Transportation Means: Patient has transportation.   Supports:  Patient has a support system.   Sonya Novak, Joesph July

## 2013-09-14 NOTE — Progress Notes (Signed)
Adult Psychoeducational Group Note  Date:  09/14/2013 Time:  9:45 PM  Group Topic/Focus:  Wrap-Up Group:   The focus of this group is to help patients review their daily goal of treatment and discuss progress on daily workbooks.  Participation Level:  Active  Participation Quality:  Appropriate  Affect:  Appropriate  Cognitive:  Appropriate  Insight: Appropriate  Engagement in Group:  Engaged  Modes of Intervention:  Discussion  Additional Comments:  Pt stated that she got to have dinner with her parents today and that she got to go outside. She learned about self perception and that when things happened its 10% what really happened and 90% of what she thought happened and that it's important what she thinks too.   Sonya Novak 09/14/2013, 9:45 PM
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
## 2013-09-14 NOTE — Progress Notes (Signed)
D: Patient resting in bed with eyes closed.  Respirations even and unlabored.  Patient appears to be in no apparent distress. A: Staff to monitor Q 15 mins for safety.   R:Patient remains safe on the unit.  

## 2013-09-14 NOTE — Progress Notes (Signed)
Adult Psychoeducational Group Note  Date:  09/13/13 Time:  8:00 pm  Group Topic/Focus:  Wrap-Up Group:   The focus of this group is to help patients review their daily goal of treatment and discuss progress on daily workbooks.  Participation Level:  Active  Participation Quality:  Appropriate and Sharing  Affect:  Appropriate  Cognitive:  Appropriate  Insight: Appropriate  Engagement in Group:  Engaged  Modes of Intervention:  Discussion, Education, Socialization and Support  Additional Comments:  Pt stated she is hospital due to depression. Pt stated she is caring and sweet.   Nichalas Coin 09/14/2013, 12:24 AM

## 2013-09-14 NOTE — Progress Notes (Signed)
Patient ID: Sonya Novak, female   DOB: 10-08-1971, 42 y.o.   MRN: [id number]  D: Patient presents with depressed mood and  affect. Patient denies SI/HI and A/V hallucinations to writer however on her patient daily inventory sheet she endorses SI within the last 24 hours. Patient verbally contracts for safety with staff. Patient rates her depression and hopelessness at a 10/10 today. Patient states that she is worried about losing her job and the work she has after she leaves. Patient asked writer about getting her regularly scheduled medications. Patient requested PRN Tylenol for a headache.  A: Writer gave patient support and encouragement and explained that a physician or extender would assess patient before any medications that the patient took at home were prescribed. Writer gave patient PRN Tylenol for her complaint.   R: Patient is cooperative and receptive. Patient verbalized understanding about her medications. Q15 minute safety checks are maintained and patient is safe at this time.
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
## 2013-09-15 DIAGNOSIS — F4323 Adjustment disorder with mixed anxiety and depressed mood: Secondary | ICD-10-CM

## 2013-09-15 DIAGNOSIS — Z634 Disappearance and death of family member: Secondary | ICD-10-CM

## 2013-09-15 DIAGNOSIS — F988 Other specified behavioral and emotional disorders with onset usually occurring in childhood and adolescence: Secondary | ICD-10-CM

## 2013-09-15 DIAGNOSIS — F411 Generalized anxiety disorder: Secondary | ICD-10-CM

## 2013-09-15 DIAGNOSIS — F332 Major depressive disorder, recurrent severe without psychotic features: Principal | ICD-10-CM

## 2013-09-15 DIAGNOSIS — R45851 Suicidal ideations: Secondary | ICD-10-CM

## 2013-09-15 NOTE — Progress Notes (Signed)
Recreation Therapy Notes  Date: 11.13.2014 Time: 2:45pm Location: [address] Dayroom  Group Topic: Software engineer Activities (AAA)  Behavioral Response: Engaged, Attentive, Appropriate   Affect: Euthymic  Clinical Observations/Feedback: Dog Team: [employer] Patient interacted appropriately with peer, dog team, and LRT.   Marykay Lex Sedona Wenk, LRT/CTRS  Jearl Klinefelter 09/15/2013 4:52 PM

## 2013-09-15 NOTE — [facility] Group Notes (Signed)
[facility] LCSW Group Therapy  Living A Balanced Life  1:15 - 2: 30            09/15/2013  3:40 PM  Type of Therapy:  Group Therapy  Participation Level:  Appropriate  Participation Quality:  Appropriate  Affect:  Appropriate  Cognitive:  Attentive Appropriate  Insight:  Engaged  Engagement in Therapy:  Engaged  Modes of Intervention:  Discussion Exploration Problem-Solving Supportive.   Summary of Progress/Problems: The topic for group was living a balanced life.  Patient actively engaged in discussion and was able to identify ways life is out of balance.   Patient able to identify changes that will allow for a balanced living including spending more time with family and friends.    Sonya Novak 09/15/2013  3:40 PM

## 2013-09-15 NOTE — [facility] Suicide Risk Assessment (Signed)
[facility] INPATIENT:  Family/Significant Other Suicide Prevention Education  Suicide Prevention Education:  Education Completed; Tracey Harries, Mother, [phone]; has been identified by the patient as the family member/significant other with whom the patient will be residing, and identified as the person(s) who will aid the patient in the event of a mental health crisis (suicidal ideations/suicide attempt).  With written consent from the patient, the family member/significant other has been provided the following suicide prevention education, prior to the and/or following the discharge of the patient.  The suicide prevention education provided includes the following:  Suicide risk factors  Suicide prevention and interventions  National Suicide Hotline telephone number  [facility] assessment telephone number  [facility] Emergency Assistance 911  [facility] and/or Residential Mobile Crisis Unit telephone number  Request made of family/significant other to:  Remove weapons (e.g., guns, rifles, knives), all items previously/currently identified as safety concern.  Mother advised patient does not have access to weapons.    Remove drugs/medications (over-the-counter, prescriptions, illicit drugs), all items previously/currently identified as a safety concern.  The family member/significant other verbalizes understanding of the suicide prevention education information provided.  The family member/significant other agrees to remove the items of safety concern listed above.  Wynn Banker 09/15/2013, 9:26 AM

## 2013-09-15 NOTE — Progress Notes (Signed)
Recreation Therapy Notes  Date: 11.12.2014 Time: 3:00pm Location: [address] Dayroom    Group Topic: Communication, Team Building, Problem Solving  Goal Area(s) Addresses:  Patient will effectively work with peer towards shared goal.  Patient will identify skill used to make activity successful.  Patient will identify how skills used during activity can be used to reach post d/c goals.   Behavioral Response: Engaged, Attentive  Intervention: Problem Solving Activity  Activity: Landing Pad. In teams patients were given 12 plastic drinking straws and a length of masking tape. Using the materials provided patients were asked to build a landing pad to catch a golf ball dropped from approximately 6 feet in the air.   Education: Special educational needs teacher, Team Work, Building control surveyor, Journalist, newspaper.   Education Outcome: Acknowledges understanding   Clinical Observations/Feedback: Patient actively engaged in group activity, offering suggestions and assisting with construction of team's landing pad. Patient identified team work as a Marketing executive used during activity, patient related team work to working within her support system post d/c. Patient additionally identified benefit of using problem solving post d/c as being able to evaluate problems she's experiences and finding different solutions and a benefit of communication as being able to ask for help when needed.   Marykay Lex Alesana Magistro, LRT/CTRS  Grethel Zenk L 09/15/2013 8:15 AM

## 2013-09-15 NOTE — Progress Notes (Signed)
[facility] MD Progress Note  09/15/2013 12:22 PM Sonya Novak  MRN:  [id number]  Subjective:  Sonya Novak is up and active in groups and unit programming. She is doing well and reports improved sleep, improved appetite, clearer thinking, and much improved depression and anxiety.  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  Asthma      related to allergies- occ inhaler use   .  Anxiety    .  Depression    .  Recurrent upper respiratory infection (URI)      allergy related    AXIS IV: other psychosocial or environmental problems, problems related to social environment and problems with primary support group  AXIS V: 41-50 serious symptoms     ADL's:  Intact  Sleep: Good  Appetite:  Good  Suicidal Ideation:  decreasing Homicidal Ideation:  denies AEB (as evidenced by):  Psychiatric Specialty Exam: Review of Systems  Constitutional: Negative.  Negative for fever, chills, weight loss, malaise/fatigue and diaphoresis.  HENT: Negative for congestion and sore throat.   Eyes: Negative for blurred vision, double vision and photophobia.  Respiratory: Negative for cough, shortness of breath and wheezing.   Cardiovascular: Negative for chest pain, palpitations and PND.  Gastrointestinal: Negative for heartburn, nausea, vomiting, abdominal pain, diarrhea and constipation.  Musculoskeletal: Negative for falls, joint pain and myalgias.  Neurological: Negative for dizziness, tingling, tremors, sensory change, speech change, focal  weakness, seizures, loss of consciousness, weakness and headaches.  Endo/Heme/Allergies: Negative for polydipsia. Does not bruise/bleed easily.  Psychiatric/Behavioral: Negative for depression, suicidal ideas, hallucinations, memory loss and substance abuse. The patient is not nervous/anxious and does not have insomnia.     Blood pressure 113/80, pulse 76, temperature 98.1 F (36.7 C), temperature source Oral, resp. rate 16, height 5\' 3"  (1.6 m), weight 59.875 kg (132 lb), last menstrual period 08/23/2013.Body mass index is 23.39 kg/(m^2).  General Appearance: Casual  Eye Contact::  Good  Speech:  Clear and Coherent  Volume:  Normal  Mood:  Anxious and Depressed  Affect:  Congruent  Thought Process:  Goal Directed  Orientation:  Full (Time, Place, and Person)  Thought Content:  WDL  Suicidal Thoughts:  Yes.  without intent/plan  Homicidal Thoughts:  No  Memory:  Immediate;   Fair  Judgement:  Intact  Insight:  Shallow  Psychomotor Activity:  Normal  Concentration:  Fair  Recall:  Fair  Akathisia:  No  Handed:  Right  AIMS (if indicated):     Assets:  Communication Skills Housing Physical Health  Sleep:  Number of Hours: 6.75   Current Medications: Current Facility-Administered Medications  Medication Dose Route Frequency Provider Last Rate Last Dose  . acetaminophen (TYLENOL) tablet 650 mg  650 mg Oral Q6H PRN Fransisca Kaufmann, NP   650 mg at 09/14/13 0759  . alum & mag hydroxide-simeth (MAALOX/MYLANTA) 200-200-20 MG/5ML suspension 30 mL  30 mL Oral Q4H PRN Fransisca Kaufmann, NP      . buPROPion (WELLBUTRIN XL) 24 hr tablet 300 mg  300 mg Oral Daily Nehemiah Settle, MD   300 mg at 09/15/13 0750  . busPIRone (BUSPAR) tablet 7.5  mg  7.5 mg Oral BID Nehemiah Settle, MD   7.5 mg at 09/15/13 0750  . hydrOXYzine (ATARAX/VISTARIL) tablet 25 mg  25 mg Oral Q6H PRN Fransisca Kaufmann, NP   25 mg at 09/14/13 2149  . loratadine (CLARITIN) tablet 10 mg  10 mg Oral Daily PRN Nehemiah Settle, MD      . magnesium hydroxide (MILK OF MAGNESIA) suspension 30 mL  30 mL Oral Daily PRN Fransisca Kaufmann, NP        Lab Results:  Results for orders placed during the hospital encounter of 09/13/13 (from the past 48 hour(s))  URINALYSIS, ROUTINE W REFLEX MICROSCOPIC     Status: Abnormal   Collection Time    09/13/13  7:13 PM      Result Value Range   Color, Urine YELLOW  YELLOW   APPearance CLEAR  CLEAR   Specific Gravity, Urine 1.023  1.005 - 1.030   pH 5.5  5.0 - 8.0   Glucose, UA NEGATIVE  NEGATIVE mg/dL   Hgb urine dipstick TRACE (*) NEGATIVE   Bilirubin Urine NEGATIVE  NEGATIVE   Ketones, ur 15 (*) NEGATIVE mg/dL   Protein, ur NEGATIVE  NEGATIVE mg/dL   Urobilinogen, UA 0.2  0.0 - 1.0 mg/dL   Nitrite NEGATIVE  NEGATIVE   Leukocytes, UA NEGATIVE  NEGATIVE   Comment: Performed at [facility]  PREGNANCY, URINE     Status: None   Collection Time    09/13/13  7:13 PM      Result Value Range   Preg Test, Ur NEGATIVE  NEGATIVE   Comment:            THE SENSITIVITY OF THIS     METHODOLOGY IS >20 mIU/mL.     Performed at [facility]  URINE RAPID DRUG SCREEN (HOSP PERFORMED)     Status: Abnormal   Collection Time    09/13/13  7:13 PM      Result Value Range   Opiates NONE DETECTED  NONE DETECTED   Cocaine NONE DETECTED  NONE DETECTED   Benzodiazepines POSITIVE (*) NONE DETECTED   Amphetamines POSITIVE (*) NONE DETECTED   Tetrahydrocannabinol NONE DETECTED  NONE DETECTED   Barbiturates NONE DETECTED  NONE DETECTED   Comment:            DRUG SCREEN FOR MEDICAL PURPOSES     ONLY.  IF CONFIRMATION IS NEEDED     FOR ANY PURPOSE, NOTIFY LAB     WITHIN 5 DAYS.                LOWEST DETECTABLE LIMITS     FOR URINE DRUG SCREEN     Drug Class       Cutoff (ng/mL)     Amphetamine      1000     Barbiturate      200     Benzodiazepine   200     Tricyclics       300     Opiates          300     Cocaine          300     THC               50     Performed at [facility]  URINE MICROSCOPIC-ADD ON     Status: None   Collection Time    09/13/13  7:13 PM  Result Value Range   Squamous Epithelial / LPF RARE  RARE   WBC, UA 0-2  <3 WBC/hpf   RBC / HPF 0-2  <3 RBC/hpf   Bacteria, UA RARE  RARE   Comment: Performed at [facility]  CBC     Status: None   Collection Time    09/13/13  7:55 PM      Result Value Range   WBC 8.9  4.0 - 10.5 K/uL   RBC 4.67  3.87 - 5.11 MIL/uL   Hemoglobin 14.9  12.0 - 15.0 g/dL   HCT 16.1  09.6 - 04.5 %   MCV 90.6  78.0 - 100.0 fL   MCH 31.9  26.0 - 34.0 pg   MCHC 35.2  30.0 - 36.0 g/dL   RDW 40.9  81.1 - 91.4 %   Platelets 384  150 - 400 K/uL   Comment: Performed at [facility]  COMPREHENSIVE METABOLIC PANEL     Status: None   Collection Time    09/13/13  7:55 PM      Result Value Range   Sodium 137  135 - 145 mEq/L   Potassium 3.8  3.5 - 5.1 mEq/L   Chloride 100  96 - 112 mEq/L   CO2 27  19 - 32 mEq/L   Glucose, Bld 92  70 - 99 mg/dL   BUN 12  6 - 23 mg/dL   Creatinine, Ser 7.82  0.50 - 1.10 mg/dL   Calcium 95.6  8.4 - 21.3 mg/dL   Total Protein 7.5  6.0 - 8.3 g/dL   Albumin 4.3  3.5 - 5.2 g/dL   AST 15  0 - 37 U/L   ALT 11  0 - 35 U/L   Alkaline Phosphatase 95  39 - 117 U/L   Total Bilirubin 0.5  0.3 - 1.2 mg/dL   GFR calc non Af Amer >90  >90 mL/min   GFR calc Af Amer >90  >90 mL/min   Comment: (NOTE)     The eGFR has been calculated using the CKD EPI equation.     This calculation has not been validated in all clinical situations.     eGFR's persistently <90 mL/min signify possible Chronic Kidney     Disease.     Performed at [facility]  TSH     Status: None   Collection Time    09/13/13  7:55 PM      Result Value Range   TSH 3.471  0.350 - 4.500 uIU/mL   Comment: Performed at [facility]  ETHANOL     Status: None   Collection Time    09/13/13  7:55 PM      Result Value Range    Alcohol, Ethyl (B) <11  0 - 11 mg/dL   Comment:            LOWEST DETECTABLE LIMIT FOR     SERUM ALCOHOL IS 11 mg/dL     FOR MEDICAL PURPOSES ONLY     Performed at [facility]    Physical Findings: AIMS: Facial and Oral Movements Muscles of Facial Expression: None, normal Lips and Perioral Area: None, normal Jaw: None, normal Tongue: None, normal,Extremity Movements Upper (arms, wrists, hands, fingers): None, normal Lower (legs, knees, ankles, toes): None, normal, Trunk Movements Neck, shoulders, hips: None, normal, Overall Severity Severity of abnormal movements (highest score from questions above): None, normal Incapacitation due to abnormal movements: None, normal  Patient's awareness of abnormal movements (rate only patient's report): No Awareness, Dental Status Current problems with teeth and/or dentures?: No Does patient usually wear dentures?: No  CIWA:    COWS:     Treatment Plan Summary: Daily contact with patient to assess and evaluate symptoms and progress in treatment Medication management  Plan: 1. Continue crisis management and stabilization. 2. Medication management to reduce current symptoms to base line and improve patient's overall level of functioning 3. Treat health problems as indicated. 4. Develop treatment plan to decrease risk of relapse upon discharge and the need for     readmission. 5. Psycho-social education regarding relapse prevention and self care. 6. Health care follow up as needed for medical problems. 7. Continue home medications where appropriate. 8. Disposition is in progress. 9/ Anticipate D/C Monday or sooner if continues to improve.    Medical Decision Making Problem Points:  Established problem, stable/improving (1) Data Points:  Review or order medicine tests (1)  I certify that inpatient services furnished can reasonably be expected to improve the patient's condition.    Rona Ravens. [place] RPAC 9:14  PM 09/15/2013  Reviewed the information documented and agree with the treatment plan.  Jackee Glasner,JANARDHAHA R. 09/17/2013 11:11 AM

## 2013-09-15 NOTE — Progress Notes (Addendum)
Patient ID: Sonya Novak, female   DOB: 26-Feb-1971, 42 y.o.   MRN: [id number] [facility]  9:00 a.m.  The focus of this group is to educate the patient on the purpose and policies of crisis stabilization and provide a format to answer questions about their admission.  The group details unit policies and expectations of patients while admitted.  Patient was attentive and engaged in group. Patient participated in stretching exercises and was able to think of a goal for today. Her goal for the day was to not cry anymore sad tears.

## 2013-09-15 NOTE — Progress Notes (Signed)
Adult Psychoeducational Group Note  Date:  09/15/2013 Time:  8:00PM Group Topic/Focus:  Karaoke  Participation Level:  Active  Participation Quality:  Appropriate and Attentive  Affect:  Appropriate  Cognitive:  Alert and Appropriate  Insight: Appropriate  Engagement in Group:  Engaged  Modes of Intervention:  Discussion Pt. Was appropriate and attentive during tonight's group. Pt attended Karaoke. Additional Comments:    April Manson 09/15/2013, 8:32 PM

## 2013-09-15 NOTE — [facility] Suicide Risk Assessment (Signed)
[facility] INPATIENT:  Family/Significant Other Suicide Prevention Education  Suicide Prevention Education:  Education Completed; Tracey Harries, Mother, [phone],  as been identified by the patient as the family member/significant other with whom the patient will be residing, and identified as the person(s) who will aid the patient in the event of a mental health crisis (suicidal ideations/suicide attempt).  With written consent from the patient, the family member/significant other has been provided the following suicide prevention education, prior to the and/or following the discharge of the patient.  The suicide prevention education provided includes the following:  Suicide risk factors  Suicide prevention and interventions  National Suicide Hotline telephone number  [facility] assessment telephone number  [facility] Emergency Assistance 911  [facility] and/or Residential Mobile Crisis Unit telephone number  Request made of family/significant other to:  Remove weapons (e.g., guns, rifles, knives), all items previously/currently identified as safety concern.  Mother advised patient does not have access to guns.  Remove drugs/medications (over-the-counter, prescriptions, illicit drugs), all items previously/currently identified as a safety concern.  The family member/significant other verbalizes understanding of the suicide prevention education information provided.  The family member/significant other agrees to remove the items of safety concern listed above.  Wynn Banker 09/15/2013, 12:04 PM

## 2013-09-15 NOTE — Progress Notes (Signed)
Adult Psychoeducational Group Note  Date:  09/15/2013 Time:  1:55 PM  Group Topic/Focus:  Self Esteem Action Plan:   The focus of this group is to help patients create a plan to continue to build self-esteem after discharge.  Participation Level:  Active  Participation Quality:  Appropriate and Attentive  Affect:  Appropriate  Cognitive:  Alert and Appropriate  Insight: Good  Engagement in Group:  Engaged  Modes of Intervention:  Activity, Discussion, Exploration, Socialization and Support  Additional Comments:  Pt came to group and shared on several occassions.  Pt completed a self-esteem action plan and discussed several activities and strategies to help develop a positive self-esteem. Pt was encouraged throughout the group to share the answers to the questions and get feedback and support from the group. Pt discussed positive things about themselves and made positive goals for the future.   Cathlean Cower 09/15/2013, 1:55 PM

## 2013-09-15 NOTE — Progress Notes (Signed)
D:Pt reports having a better day as the day progressed. Adjusting to the unit and the schedule helped eased her anxiety. She enjoyed the material discussed in group today. She also enjoyed talking to the [place]. Pt is anticipating the Chaplain's return visit on Thursday. Currently she is denying any concerns she wishes for this writer to address at this time. She is negative for SI/HI/AVH.  A: Pt informed about Vistaril's indications. Vistaril was administered for sleep. Continued support and availability as needed was extended to this pt. Staff continue to monitor pt with q68min checks.  R: No adverse drug reactions noted. Pt receptive to treatment. Pt remains safe at this time.

## 2013-09-15 NOTE — Progress Notes (Signed)
Patient ID: Sonya Novak, female   DOB: 05/27/1971, 42 y.o.   MRN: [id number]  D: Patient presents with mood and affect that is appropriate to circumstance and situation. Patient denies pain or discomfort. Patient denies SI/HI and A/V hallucinations. Pt rates her depression at a 3/10 and her hopelessness at a 1/10 today. Patient reports that she wants to learn how to say no without confidence or guilt. Pt reports that she also wants to learn about setting healthy boundaries.  A: Encouragement and support are given to patient by Clinical research associate. Medications are administered to patient per physician's orders.  R: Patient is receptive and cooperative. Q15 minute safety checks are maintained and patient is safe at this time.

## 2013-09-16 MED ORDER — BUSPIRONE HCL 7.5 MG PO TABS: ORAL_TABLET | ORAL | Status: DC

## 2013-09-16 MED ORDER — BUPROPION HCL ER (XL) 300 MG PO TB24: 300.0000 mg | ORAL_TABLET | Freq: Every day | ORAL | Status: DC

## 2013-09-16 NOTE — Discharge Summary (Signed)
Physician Discharge Summary Note  Patient:  Sonya Novak is an 42 y.o., female MRN:  [id number] DOB:  02-Jan-1971 Patient phone:  [phone] (home)  Patient address:   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  Asthma      related to allergies- occ inhaler use   .  Anxiety    .  Depression    .  Recurrent upper respiratory infection (URI)      allergy related    AXIS IV: other psychosocial or environmental problems, problems related to social environment and problems with primary support group  AXIS V: 41-50 serious symptoms   Level of Care:  OP  Hospital Course:  Marshall Roehrich is a 42 year old DWF who was admitted from the out patient service of Dr. Daleen Bo to the Adult unit for worsening depression with suicidal ideation, poor self esteem, and suicidal ideations after she had to put down her beloved dog due to it's poor health. This had triggered memories of her own abortion previously which she is still conflicted about.     Vernesha was admitted to the unit for crisis stabilization and medication management. Admission labs were drawn and she  was oriented to the unit. She was further evaluated by an in patient psychiatrist and medication management was initiated. Medical problems were identified and treated as needed.      Diannie was evaluated each day by a clinical provider to ascertain the patient's response to treatment.  Improvement was noted by the patient's report of decreasing symptoms, improved sleep and appetite, affect, medication tolerance, behavior, and participation in unit programming.         Adysen was asked each day to complete a self inventory noting mood, mental status, pain, new symptoms, anxiety and concerns.         She responded well to medication and being in a therapeutic and supportive environment. Positive and appropriate behavior was noted and the patient was motivated for recovery. The patient worked closely with the treatment team and case manager to develop a discharge plan with appropriate goals. Coping skills, problem solving as well as relaxation therapies were also part of the unit programming.         By the day of discharge Kumari was in much improved condition than upon admission.  Symptoms were reported as significantly decreased or resolved completely. The patient denied SI/HI and voiced no AVH. She was motivated to continue taking medication with a goal of continued improvement in mental health.          Eutha was discharged home with a plan to follow up as noted below. Consults:  None  Significant Diagnostic Studies:  labs: CBC, CMP, UA, UDS  Discharge Vitals:   Blood pressure 126/86, pulse 96, temperature 97.9 F (36.6 C), temperature  source Oral, resp. rate 16, height 5\' 3"  (1.6 m), weight 59.875 kg (132 lb), last menstrual period 08/23/2013. Body mass index is 23.39 kg/(m^2). Lab Results:   Results for orders placed during the hospital encounter of 09/13/13 (from the past 72 hour(s))  URINALYSIS, ROUTINE W REFLEX MICROSCOPIC     Status: Abnormal   Collection Time    09/13/13  7:13 PM       Result Value Range   Color, Urine YELLOW  YELLOW   APPearance CLEAR  CLEAR   Specific Gravity, Urine 1.023  1.005 - 1.030   pH 5.5  5.0 - 8.0   Glucose, UA NEGATIVE  NEGATIVE mg/dL   Hgb urine dipstick TRACE (*) NEGATIVE   Bilirubin Urine NEGATIVE  NEGATIVE   Ketones, ur 15 (*) NEGATIVE mg/dL   Protein, ur NEGATIVE  NEGATIVE mg/dL   Urobilinogen, UA 0.2  0.0 - 1.0 mg/dL   Nitrite NEGATIVE  NEGATIVE   Leukocytes, UA NEGATIVE  NEGATIVE   Comment: Performed at [facility]  PREGNANCY, URINE     Status: None   Collection Time    09/13/13  7:13 PM      Result Value Range   Preg Test, Ur NEGATIVE  NEGATIVE   Comment:            THE SENSITIVITY OF THIS     METHODOLOGY IS >20 mIU/mL.     Performed at [facility]  URINE RAPID DRUG SCREEN (HOSP PERFORMED)     Status: Abnormal   Collection Time    09/13/13  7:13 PM      Result Value Range   Opiates NONE DETECTED  NONE DETECTED   Cocaine NONE DETECTED  NONE DETECTED   Benzodiazepines POSITIVE (*) NONE DETECTED   Amphetamines POSITIVE (*) NONE DETECTED   Tetrahydrocannabinol NONE DETECTED  NONE DETECTED   Barbiturates NONE DETECTED  NONE DETECTED   Comment:            DRUG SCREEN FOR MEDICAL PURPOSES     ONLY.  IF CONFIRMATION IS NEEDED     FOR ANY PURPOSE, NOTIFY LAB     WITHIN 5 DAYS.                LOWEST DETECTABLE LIMITS     FOR URINE DRUG SCREEN     Drug Class       Cutoff (ng/mL)     Amphetamine      1000     Barbiturate      200     Benzodiazepine   200     Tricyclics       300     Opiates          300     Cocaine          300     THC              50     Performed at [facility]  URINE MICROSCOPIC-ADD ON     Status: None   Collection Time    09/13/13  7:13 PM      Result Value Range   Squamous Epithelial / LPF RARE  RARE   WBC, UA 0-2  <3 WBC/hpf   RBC / HPF 0-2  <3 RBC/hpf   Bacteria, UA RARE  RARE   Comment: Performed at [facility]   CBC     Status: None   Collection Time  09/13/13  7:55 PM      Result Value Range   WBC 8.9  4.0 - 10.5 K/uL   RBC 4.67  3.87 - 5.11 MIL/uL   Hemoglobin 14.9  12.0 - 15.0 g/dL   HCT 16.1  09.6 - 04.5 %   MCV 90.6  78.0 - 100.0 fL   MCH 31.9  26.0 - 34.0 pg   MCHC 35.2  30.0 - 36.0 g/dL   RDW 40.9  81.1 - 91.4 %   Platelets 384  150 - 400 K/uL   Comment: Performed at [facility]  COMPREHENSIVE METABOLIC PANEL     Status: None   Collection Time    09/13/13  7:55 PM      Result Value Range   Sodium 137  135 - 145 mEq/L   Potassium 3.8  3.5 - 5.1 mEq/L   Chloride 100  96 - 112 mEq/L   CO2 27  19 - 32 mEq/L   Glucose, Bld 92  70 - 99 mg/dL   BUN 12  6 - 23 mg/dL   Creatinine, Ser 7.82  0.50 - 1.10 mg/dL   Calcium 95.6  8.4 - 21.3 mg/dL   Total Protein 7.5  6.0 - 8.3 g/dL   Albumin 4.3  3.5 - 5.2 g/dL   AST 15  0 - 37 U/L   ALT 11  0 - 35 U/L   Alkaline Phosphatase 95  39 - 117 U/L   Total Bilirubin 0.5  0.3 - 1.2 mg/dL   GFR calc non Af Amer >90  >90 mL/min   GFR calc Af Amer >90  >90 mL/min   Comment: (NOTE)     The eGFR has been calculated using the CKD EPI equation.     This calculation has not been validated in all clinical situations.     eGFR's persistently <90 mL/min signify possible Chronic Kidney     Disease.     Performed at [facility]  TSH     Status: None   Collection Time    09/13/13  7:55 PM      Result Value Range   TSH 3.471  0.350 - 4.500 uIU/mL   Comment: Performed at [facility]  ETHANOL     Status: None   Collection Time    09/13/13  7:55 PM      Result Value Range   Alcohol, Ethyl (B) <11  0 - 11 mg/dL   Comment:            LOWEST DETECTABLE LIMIT FOR     SERUM ALCOHOL IS 11 mg/dL     FOR MEDICAL PURPOSES ONLY     Performed at [facility]    Physical Findings: AIMS: Facial and Oral Movements Muscles of Facial Expression: None, normal Lips and Perioral Area: None,  normal Jaw: None, normal Tongue: None, normal,Extremity Movements Upper (arms, wrists, hands, fingers): None, normal Lower (legs, knees, ankles, toes): None, normal, Trunk Movements Neck, shoulders, hips: None, normal, Overall Severity Severity of abnormal movements (highest score from questions above): None, normal Incapacitation due to abnormal movements: None, normal Patient's awareness of abnormal movements (rate only patient's report): No Awareness, Dental Status Current problems with teeth and/or dentures?: No Does patient usually wear dentures?: No  CIWA:    COWS:     Psychiatric Specialty Exam: See Psychiatric Specialty Exam and Suicide Risk Assessment completed by Attending Physician prior to discharge.  Discharge destination:  Home  Is patient on multiple antipsychotic therapies at discharge:  No   Has Patient had three or more failed trials of antipsychotic monotherapy by history:  No  Recommended Plan for Multiple Antipsychotic Therapies: NA  Discharge Orders   Future Orders Complete By Expires   Diet - low sodium heart healthy  As directed    Discharge instructions  As directed    Comments:     Take all of your medications as directed. Be sure to keep all of your follow up appointments.  If you are unable to keep your follow up appointment, call your Doctor's office to let them know, and reschedule.  Make sure that you have enough medication to last until your appointment. Be sure to get plenty of rest. Going to bed at the same time each night will help. Try to avoid sleeping during the day.  Increase your activity as tolerated. Regular exercise will help you to sleep better and improve your mental health. Eating a heart healthy diet is recommended. Try to avoid salty or fried foods. Be sure to avoid all alcohol and illegal drugs.   Increase activity slowly  As directed        Medication List    STOP taking these medications       ALPRAZolam 0.5 MG tablet   Commonly known as:  XANAX     amphetamine-dextroamphetamine 20 MG 24 hr capsule  Commonly known as:  ADDERALL XR      TAKE these medications     Indication   buPROPion 300 MG 24 hr tablet  Commonly known as:  WELLBUTRIN XL  Take 1 tablet (300 mg total) by mouth daily. For depression.   Indication:  Depressive Phase of Manic-Depression     busPIRone 7.5 MG tablet  Commonly known as:  BUSPAR  Take one tablet (7.5mg ) by mouth twice a day for anxiety.   Indication:  Anxiety Disorder     loratadine 10 MG tablet  Commonly known as:  CLARITIN  Take 10 mg by mouth daily as needed for allergies.            Follow-up Information   Follow up with [facility] On 09/21/2013. (You are scheduled with Keene Breath on Wednesday, September 21, 2013 at [facility].  Please arrive at 1:40 to complete registration)    Contact information:   [address] [place], [place]   [address]  [phone]      Follow-up recommendations:   Activities: Resume activity as tolerated. Diet: Heart healthy low sodium diet Tests: Follow up testing will be determined by your out patient provider. Comments:    Total Discharge Time:  Less than 30 minutes.  Signed: MASHBURN,NEIL 09/16/2013, 1:40 PM  Patient was seen personally for psychiatric evaluation, suicide risk assessment and case discussed with her physician extender and formulate a treatment and disposition plan.Reviewed the information documented and agree with the treatment plan.  Tomicka Lover,JANARDHAHA R. 09/21/2013 4:30 PM

## 2013-09-16 NOTE — Progress Notes (Signed)
[facility] Adult Case Management Discharge Plan :  Will you be returning to the same living situation after discharge: Yes,  Patient will return to her home At discharge, do you have transportation home?:Yes,  Patient to arrange transportation home. Do you have the ability to pay for your medications:Yes,  Patient is able to afford medications.  Release of information consent forms completed and in the chart;  Patient's signature needed at discharge.  Patient to Follow up at: Follow-up Information   Follow up with [facility] On 09/21/2013. (You are scheduled with Keene Breath on Wednesday, September 21, 2013 at [facility]  Please arrive at 1:40 to complete registration)    Contact information:   [address] [place], [place]   [address]  [phone]      Patient denies SI/HI:  Patient no longer endorsing SI/HI or other thoughts of self harm.  Safety Planning and Suicide Prevention discussed: .Reviewed with all patients during discharge planning group   Anniebelle Devore, Joesph July 09/16/2013, 10:37 AM

## 2013-09-16 NOTE — Progress Notes (Signed)
D: Pt denies SI/HI/AVH. Pt is pleasant and cooperative. Pt stated the day was "good, I got to talk to a lot of people, they had pet therapy"  A: Pt was offered support and encouragement. Pt was given scheduled medications. Pt was encourage to attend groups. Q 15 minute checks were done for safety.   R:Pt attends groups and interacts well with peers and staff. Pt is taking medication. Pt has no complaints at this time .Pt receptive to treatment and safety maintained on unit.

## 2013-09-16 NOTE — [facility] Suicide Risk Assessment (Signed)
Suicide Risk Assessment  Discharge Assessment     Demographic Factors:  Adolescent or young adult, Caucasian and Low socioeconomic status  Mental Status Per Nursing Assessment::   On Admission:  Suicidal ideation indicated by patient;Suicide plan;Self-harm thoughts  Current Mental Status by Physician: Mental Status Examination: Patient appeared as per his stated age, casually dressed, and fairly groomed, and maintaining good eye contact. Patient has good mood and his affect was constricted. He has normal rate, rhythm, and volume of speech. His thought process is linear and goal directed. Patient has denied suicidal, homicidal ideations, intentions or plans. Patient has no evidence of auditory or visual hallucinations, delusions, and paranoia. Patient has fair insight judgment and impulse control.  Loss Factors: Financial problems/change in socioeconomic status  Historical Factors: Prior suicide attempts, Family history of mental illness or substance abuse and Impulsivity  Risk Reduction Factors:   Sense of responsibility to family, Religious beliefs about death, Living with another person, especially a relative, Positive social support, Positive therapeutic relationship and Positive coping skills or problem solving skills  Continued Clinical Symptoms:  Depression:   Recent sense of peace/wellbeing Unstable or Poor Therapeutic Relationship Previous Psychiatric Diagnoses and Treatments Medical Diagnoses and Treatments/Surgeries  Cognitive Features That Contribute To Risk:  Polarized thinking    Suicide Risk:  Minimal: No identifiable suicidal ideation.  Patients presenting with no risk factors but with morbid ruminations; may be classified as minimal risk based on the severity of the depressive symptoms  Discharge Diagnoses:   AXIS I:  Major Depression, Recurrent severe AXIS II:  Deferred AXIS III:   Past Medical History  Diagnosis Date  . Asthma     related to allergies- occ  inhaler use  . Anxiety   . Depression   . Recurrent upper respiratory infection (URI)     allergy related   AXIS IV:  economic problems, other psychosocial or environmental problems, problems related to social environment and problems with primary support group AXIS V:  61-70 mild symptoms  Plan Of Care/Follow-up recommendations:  Activity:  As tolerated Diet:  Regular  Is patient on multiple antipsychotic therapies at discharge:  No   Has Patient had three or more failed trials of antipsychotic monotherapy by history:  No  Recommended Plan for Multiple Antipsychotic Therapies: NA  Sonya Novak,Sonya R. 09/16/2013, 12:32 PM

## 2013-09-16 NOTE — [facility] Group Notes (Signed)
[facility] LCSW Aftercare Discharge Planning Group Note   09/16/2013 10:36 AM    Participation Quality:  Appropraite  Mood/Affect:  Appropriate  Depression Rating:  0  Anxiety Rating:  3  Thoughts of Suicide:  No  Will you contract for safety?   NA  Current AVH:  No  Plan for Discharge/Comments:  Patient attending discharge planning group and actively participated in group.  Patient reports doing well and ready to discharge home today.  She will follow up with [facility].  CSW provided all participants with daily workbook.   Transportation Means: Patient has transportation.   Supports:  Patient has a support system.   Brandin Stetzer, Joesph July

## 2013-09-16 NOTE — Progress Notes (Signed)
D/C instructions/meds/follow-up appointments reviewed, pt verbalized understanding, pt's belongings returned to pt, samples given. 

## 2013-09-16 NOTE — Progress Notes (Signed)
D:  Per pt self inventory pt reports sleeping poorly, appetite good, energy level high, ability to pay attention good, rates depression at a 0 out of 10 and hopelessness at a 0 out of 10, denies SI/HI/AVH, pt is wide eyed, anxious, did not sleep last night despite taking prescribed sleep aid, denies pain.      A:  Emotional support provided, Encouraged pt to continue with treatment plan and attend all group activities, q15 min checks maintained for safety.  R:  Pt is receptive, cooperative with staff and other patients, attending groups, interactive in the milieu and spends much of her time in the dayroom.

## 2013-09-16 NOTE — [facility] Group Notes (Signed)
[facility] LCSW Group Therapy  Feelings Around Relapse 1:15 -2:30        09/16/2013  2:49 PM   Type of Therapy:  Group Therapy  Participation Level:  Appropriate  Participation Quality:  Appropriate  Affect:  Appropriate  Cognitive:  Attentive Appropriate  Insight:  Developing/Improving  Engagement in Therapy: Developing/Improving  Modes of Intervention:  Discussion Exploration Problem-Solving Supportive  Summary of Progress/Problems:  The topic for today was feelings around relapse.    Patient processed feelings toward relapse and was able to relate to peers. She shared relapse for her would be returning to negative thinking and making demeaning statements about herself.  Patient identified coping skills that can be used to prevent a relapse.   Wynn Banker 09/16/2013 2:49 PM

## 2013-09-16 NOTE — Tx Team (Signed)
Interdisciplinary Treatment Plan Update   Date Reviewed:  09/16/2013  Time Reviewed:  9:31 AM  Progress in Treatment:   Attending groups: Yes Participating in groups: Yes Taking medication as prescribed: Yes  Tolerating medication: Yes Family/Significant other contact made: Yes, contact made with mother Patient understands diagnosis: Yes  Discussing patient identified problems/goals with staff: Yes Medical problems stabilized or resolved: Yes Denies suicidal/homicidal ideation: Yes Patient has not harmed self or others: Yes  For review of initial/current patient goals, please see plan of care.  Estimated Length of Stay:  Discharge today  Reasons for Continued Hospitalization:   New Problems/Goals identified:    Discharge Plan or Barriers:   Home with outpatient follow up with [facility]  Additional Comments:  N/A Attendees:  Patient:  Sonya Novak 09/16/2013 9:31 AM   Signature: Mervyn Gay, MD 09/16/2013 9:31 AM  Signature:  Verne Spurr, PA 09/16/2013 9:31 AM  Signature: Harold Barban, RN 09/16/2013 9:31 AM  Signature:Elliot Cousin, RN 09/16/2013 9:31 AM  Signature:  Onnie Boer, RN  UR-CM 09/16/2013 9:31 AM  Signature:  Juline Patch, LCSW 09/16/2013 9:31 AM  Signature:  Reyes Ivan, LCSW 09/16/2013 9:31 AM  Signature:  Sharin Grave Coordinator 09/16/2013 9:31 AM  Signature:   09/16/2013 9:31 AM  Signature: Leighton Parody, RN 09/16/2013  9:31 AM  Signature:    09/16/2013  9:31 AM  Signature:   09/16/2013  9:31 AM    Scribe for Treatment Team:   Juline Patch,  09/16/2013 9:31 AM

## 2013-09-21 NOTE — Progress Notes (Signed)
Patient Discharge Instructions:  After Visit Summary (AVS):   Faxed to:  09/21/13 Discharge Summary Note:   Faxed to:  09/21/13 Psychiatric Admission Assessment Note:   Faxed to:  09/21/13 Suicide Risk Assessment - Discharge Assessment:   Faxed to:  09/21/13 Faxed/Sent to the Next Level Care provider:  09/21/13 Faxed to [facility] @ [phone]  Jerelene Redden, 09/21/2013, 4:36 PM

## 2015-07-16 ENCOUNTER — Other Ambulatory Visit: Payer: Self-pay | Admitting: Obstetrics & Gynecology

## 2015-07-16 DIAGNOSIS — N9489 Other specified conditions associated with female genital organs and menstrual cycle: Secondary | ICD-10-CM

## 2015-07-25 ENCOUNTER — Ambulatory Visit
Admission: RE | Admit: 2015-07-25 | Discharge: 2015-07-25 | Disposition: A | Discharge status: Home / Self Care | Payer: BLUE CROSS/BLUE SHIELD | Source: Ambulatory Visit | Attending: Obstetrics & Gynecology | Admitting: Obstetrics & Gynecology

## 2015-07-25 DIAGNOSIS — N9489 Other specified conditions associated with female genital organs and menstrual cycle: Secondary | ICD-10-CM

## 2015-07-25 MED ORDER — GADOBENATE DIMEGLUMINE 529 MG/ML IV SOLN
14.0000 mL | Freq: Once | INTRAVENOUS | Status: AC | PRN
  Administered 2015-07-25: 14 mL via INTRAVENOUS

## 2016-02-08 ENCOUNTER — Encounter: Payer: Self-pay | Admitting: Internal Medicine

## 2016-05-28 ENCOUNTER — Other Ambulatory Visit: Payer: Self-pay | Admitting: Obstetrics & Gynecology

## 2016-06-05 ENCOUNTER — Other Ambulatory Visit: Payer: Self-pay | Admitting: Obstetrics & Gynecology

## 2016-06-10 ENCOUNTER — Encounter (HOSPITAL_COMMUNITY): Payer: Self-pay

## 2016-06-10 ENCOUNTER — Encounter (HOSPITAL_COMMUNITY)
Admission: RE | Admit: 2016-06-10 | Discharge: 2016-06-10 | Disposition: A | Discharge status: Home / Self Care | Payer: Managed Care, Other (non HMO) | Source: Ambulatory Visit | Attending: Obstetrics & Gynecology | Admitting: Obstetrics & Gynecology

## 2016-06-10 DIAGNOSIS — Z01812 Encounter for preprocedural laboratory examination: Secondary | ICD-10-CM | POA: Diagnosis present

## 2016-06-10 HISTORY — DX: Family history of other specified conditions: Z84.89

## 2016-06-10 LAB — CBC
HCT: 37.8 % (ref 36.0–46.0)
Hemoglobin: 13 g/dL (ref 12.0–15.0)
MCH: 31.7 pg (ref 26.0–34.0)
MCHC: 34.4 g/dL (ref 30.0–36.0)
MCV: 92.2 fL (ref 78.0–100.0)
Platelets: 341 10*3/uL (ref 150–400)
RBC: 4.1 MIL/uL (ref 3.87–5.11)
RDW: 12.9 % (ref 11.5–15.5)
WBC: 7.8 10*3/uL (ref 4.0–10.5)

## 2016-06-10 LAB — TYPE AND SCREEN
ABO/RH(D): O POS
Antibody Screen: NEGATIVE

## 2016-06-10 NOTE — Patient Instructions (Signed)
Your procedure is scheduled on:06/19/16  Enter through the Main Entrance at :6am Pick up desk phone and dial [phone] and inform us of your arrival.  Please call [phone] if you have any problems the morning of surgery.  Remember: Do not eat food or drink liquids, including water, after midnight:Wed.    You may brush your teeth the morning of surgery.  Take these meds the morning of surgery with a sip of water: Zyrtec, Wellbutrin, Hydroxyzine  DO NOT wear jewelry, eye make-up, lipstick,body lotion, or dark fingernail polish.  (Polished toes are ok) You may wear deodorant.  If you are to be admitted after surgery, leave suitcase in car until your room has been assigned. Patients discharged on the day of surgery will not be allowed to drive home. Wear loose fitting, comfortable clothes for your ride home.

## 2016-06-18 NOTE — Anesthesia Preprocedure Evaluation (Addendum)
Anesthesia Evaluation  Patient identified by MRN, date of birth, ID band Patient awake    Reviewed: Allergy & Precautions, NPO status , Patient's Chart, lab work & pertinent test results  Airway Mallampati: II  TM Distance: >3 FB Neck ROM: Full    Dental  (+) Teeth Intact, Dental Advisory Given   Pulmonary asthma , former smoker,    breath sounds clear to auscultation       Cardiovascular negative cardio ROS   Rhythm:Regular Rate:Normal     Neuro/Psych PSYCHIATRIC DISORDERS Anxiety Depression negative neurological ROS     GI/Hepatic negative GI ROS, Neg liver ROS,   Endo/Other  negative endocrine ROS  Renal/GU negative Renal ROS  negative genitourinary   Musculoskeletal negative musculoskeletal ROS (+)   Abdominal Normal abdominal exam  (+)   Peds negative pediatric ROS (+)  Hematology negative hematology ROS (+)   Anesthesia Other Findings   Reproductive/Obstetrics negative OB ROS                            Lab Results  Component Value Date   WBC 7.8 06/10/2016   HGB 13.0 06/10/2016   HCT 37.8 06/10/2016   MCV 92.2 06/10/2016   PLT 341 06/10/2016   Lab Results  Component Value Date   CREATININE 0.77 09/13/2013   BUN 12 09/13/2013   NA 137 09/13/2013   K 3.8 09/13/2013   CL 100 09/13/2013   CO2 27 09/13/2013   No results found for: INR, PROTIME   Anesthesia Physical Anesthesia Plan  ASA: II  Anesthesia Plan: General   Post-op Pain Management:    Induction: Intravenous  Airway Management Planned: Oral ETT  Additional Equipment:   Intra-op Plan:   Post-operative Plan: Extubation in OR  Informed Consent: I have reviewed the patients History and Physical, chart, labs and discussed the procedure including the risks, benefits and alternatives for the proposed anesthesia with the patient or authorized representative who has indicated his/her understanding and  acceptance.   Dental advisory given  Plan Discussed with: CRNA  Anesthesia Plan Comments:         Anesthesia Quick Evaluation

## 2016-06-19 ENCOUNTER — Encounter (HOSPITAL_COMMUNITY)
Admission: RE | Disposition: A | Discharge status: Home / Self Care | Payer: Self-pay | Source: Ambulatory Visit | Attending: Obstetrics & Gynecology

## 2016-06-19 ENCOUNTER — Encounter (HOSPITAL_COMMUNITY): Payer: Self-pay

## 2016-06-19 ENCOUNTER — Ambulatory Visit (HOSPITAL_COMMUNITY): Payer: Managed Care, Other (non HMO) | Admitting: Anesthesiology

## 2016-06-19 ENCOUNTER — Ambulatory Visit (HOSPITAL_COMMUNITY)
Admission: RE | Admit: 2016-06-19 | Discharge: 2016-06-20 | Disposition: A | Discharge status: Home / Self Care | Payer: Managed Care, Other (non HMO) | Source: Ambulatory Visit | Attending: Obstetrics & Gynecology | Admitting: Obstetrics & Gynecology

## 2016-06-19 DIAGNOSIS — Z9889 Other specified postprocedural states: Secondary | ICD-10-CM

## 2016-06-19 DIAGNOSIS — J45909 Unspecified asthma, uncomplicated: Secondary | ICD-10-CM | POA: Diagnosis not present

## 2016-06-19 DIAGNOSIS — D251 Intramural leiomyoma of uterus: Secondary | ICD-10-CM | POA: Diagnosis not present

## 2016-06-19 DIAGNOSIS — R102 Pelvic and perineal pain: Secondary | ICD-10-CM | POA: Insufficient documentation

## 2016-06-19 DIAGNOSIS — Z87891 Personal history of nicotine dependence: Secondary | ICD-10-CM | POA: Insufficient documentation

## 2016-06-19 DIAGNOSIS — F418 Other specified anxiety disorders: Secondary | ICD-10-CM | POA: Insufficient documentation

## 2016-06-19 DIAGNOSIS — D259 Leiomyoma of uterus, unspecified: Secondary | ICD-10-CM | POA: Diagnosis present

## 2016-06-19 HISTORY — PX: BILATERAL SALPINGECTOMY: SHX5743

## 2016-06-19 HISTORY — PX: ROBOTIC ASSISTED SUPRACERVICAL HYSTERECTOMY: SHX6083

## 2016-06-19 LAB — PREGNANCY, URINE: Preg Test, Ur: NEGATIVE

## 2016-06-19 SURGERY — ROBOTIC ASSISTED SUPRACERVICAL HYSTERECTOMY
Anesthesia: General

## 2016-06-19 MED ORDER — HYDROMORPHONE HCL 1 MG/ML IJ SOLN
0.2500 mg | INTRAMUSCULAR | Status: DC | PRN
  Administered 2016-06-19 (×2): 0.5 mg via INTRAVENOUS

## 2016-06-19 MED ORDER — BUPIVACAINE HCL (PF) 0.25 % IJ SOLN
INTRAMUSCULAR | Status: AC
  Filled 2016-06-19: qty 30

## 2016-06-19 MED ORDER — HYDROMORPHONE HCL 1 MG/ML IJ SOLN
INTRAMUSCULAR | Status: AC
  Filled 2016-06-19: qty 1

## 2016-06-19 MED ORDER — HYDROMORPHONE HCL 1 MG/ML IJ SOLN
INTRAMUSCULAR | Status: DC | PRN
  Administered 2016-06-19 (×2): 1 mg via INTRAVENOUS

## 2016-06-19 MED ORDER — GLYCOPYRROLATE 0.2 MG/ML IJ SOLN
INTRAMUSCULAR | Status: AC
  Filled 2016-06-19: qty 4

## 2016-06-19 MED ORDER — HYDROMORPHONE HCL 1 MG/ML IJ SOLN
0.2000 mg | INTRAMUSCULAR | Status: DC | PRN
  Administered 2016-06-19 (×2): 0.6 mg via INTRAVENOUS
  Filled 2016-06-19 (×2): qty 1

## 2016-06-19 MED ORDER — LACTATED RINGERS IV SOLN
INTRAVENOUS | Status: DC
  Administered 2016-06-19 (×3): via INTRAVENOUS

## 2016-06-19 MED ORDER — KETOROLAC TROMETHAMINE 30 MG/ML IJ SOLN
INTRAMUSCULAR | Status: AC
  Filled 2016-06-19: qty 1

## 2016-06-19 MED ORDER — MEPERIDINE HCL 25 MG/ML IJ SOLN
INTRAMUSCULAR | Status: DC
Start: 2016-06-19 — End: 2016-06-19
  Filled 2016-06-19: qty 1

## 2016-06-19 MED ORDER — SODIUM CHLORIDE 0.9 % IJ SOLN
INTRAMUSCULAR | Status: AC
  Filled 2016-06-19: qty 10

## 2016-06-19 MED ORDER — MIDAZOLAM HCL 2 MG/2ML IJ SOLN
INTRAMUSCULAR | Status: AC
  Filled 2016-06-19: qty 2

## 2016-06-19 MED ORDER — PROMETHAZINE HCL 25 MG/ML IJ SOLN: 6.2500 mg | INTRAMUSCULAR | Status: DC | PRN

## 2016-06-19 MED ORDER — MEPERIDINE HCL 25 MG/ML IJ SOLN: 6.2500 mg | INTRAMUSCULAR | Status: DC | PRN

## 2016-06-19 MED ORDER — SUGAMMADEX SODIUM 200 MG/2ML IV SOLN
INTRAVENOUS | Status: DC | PRN
  Administered 2016-06-19: 200 mg via INTRAVENOUS

## 2016-06-19 MED ORDER — IBUPROFEN 600 MG PO TABS
600.0000 mg | ORAL_TABLET | Freq: Four times a day (QID) | ORAL | Status: DC | PRN
  Administered 2016-06-20 (×3): 600 mg via ORAL
  Filled 2016-06-19 (×3): qty 1

## 2016-06-19 MED ORDER — SUGAMMADEX SODIUM 200 MG/2ML IV SOLN
INTRAVENOUS | Status: AC
  Filled 2016-06-19: qty 2

## 2016-06-19 MED ORDER — LIDOCAINE HCL (CARDIAC) 20 MG/ML IV SOLN
INTRAVENOUS | Status: DC | PRN
  Administered 2016-06-19: 50 mg via INTRAVENOUS

## 2016-06-19 MED ORDER — ALBUTEROL SULFATE (2.5 MG/3ML) 0.083% IN NEBU: 2.5000 mg | INHALATION_SOLUTION | Freq: Four times a day (QID) | RESPIRATORY_TRACT | Status: DC | PRN

## 2016-06-19 MED ORDER — NEOSTIGMINE METHYLSULFATE 10 MG/10ML IV SOLN
INTRAVENOUS | Status: AC
  Filled 2016-06-19: qty 1

## 2016-06-19 MED ORDER — LACTATED RINGERS IV SOLN: INTRAVENOUS | Status: DC

## 2016-06-19 MED ORDER — SODIUM CHLORIDE 0.9 % IV SOLN
INTRAVENOUS | Status: DC | PRN
  Administered 2016-06-19: 14 mL

## 2016-06-19 MED ORDER — LACTATED RINGERS IV SOLN
INTRAVENOUS | Status: DC
  Administered 2016-06-19 – 2016-06-20 (×2): via INTRAVENOUS

## 2016-06-19 MED ORDER — VASOPRESSIN 20 UNIT/ML IV SOLN
INTRAVENOUS | Status: DC | PRN
  Administered 2016-06-19: 6 mL via INTRAMUSCULAR

## 2016-06-19 MED ORDER — EPHEDRINE SULFATE 50 MG/ML IJ SOLN
INTRAMUSCULAR | Status: DC | PRN
  Administered 2016-06-19: 5 mg via INTRAVENOUS

## 2016-06-19 MED ORDER — SCOPOLAMINE 1 MG/3DAYS TD PT72
1.0000 | MEDICATED_PATCH | Freq: Once | TRANSDERMAL | Status: DC
  Administered 2016-06-19: 1.5 mg via TRANSDERMAL

## 2016-06-19 MED ORDER — DEXAMETHASONE SODIUM PHOSPHATE 10 MG/ML IJ SOLN
INTRAMUSCULAR | Status: DC | PRN
  Administered 2016-06-19: 10 mg via INTRAVENOUS

## 2016-06-19 MED ORDER — FLUTICASONE PROPIONATE 50 MCG/ACT NA SUSP
1.0000 | Freq: Every day | NASAL | Status: DC | PRN
  Administered 2016-06-19 – 2016-06-20 (×2): 1 via NASAL
  Filled 2016-06-19: qty 16

## 2016-06-19 MED ORDER — ONDANSETRON HCL 4 MG/2ML IJ SOLN
INTRAMUSCULAR | Status: DC | PRN
  Administered 2016-06-19: 4 mg via INTRAVENOUS

## 2016-06-19 MED ORDER — KETOROLAC TROMETHAMINE 30 MG/ML IJ SOLN
INTRAMUSCULAR | Status: DC | PRN
  Administered 2016-06-19: 30 mg via INTRAVENOUS

## 2016-06-19 MED ORDER — SCOPOLAMINE 1 MG/3DAYS TD PT72
MEDICATED_PATCH | TRANSDERMAL | Status: AC
  Administered 2016-06-19: 1.5 mg via TRANSDERMAL
  Filled 2016-06-19: qty 1

## 2016-06-19 MED ORDER — FENTANYL CITRATE (PF) 100 MCG/2ML IJ SOLN
INTRAMUSCULAR | Status: AC
Start: 2016-06-19 — End: 2016-06-19
  Filled 2016-06-19: qty 2

## 2016-06-19 MED ORDER — PROPOFOL 10 MG/ML IV BOLUS
INTRAVENOUS | Status: DC | PRN
  Administered 2016-06-19: 30 mg via INTRAVENOUS
  Administered 2016-06-19: 150 mg via INTRAVENOUS

## 2016-06-19 MED ORDER — HYDROXYZINE HCL 25 MG PO TABS
25.0000 mg | ORAL_TABLET | Freq: Three times a day (TID) | ORAL | Status: DC | PRN
  Filled 2016-06-19: qty 1

## 2016-06-19 MED ORDER — SODIUM CHLORIDE 0.9 % IJ SOLN
INTRAMUSCULAR | Status: AC
  Filled 2016-06-19: qty 100

## 2016-06-19 MED ORDER — CEFAZOLIN SODIUM-DEXTROSE 2-4 GM/100ML-% IV SOLN
INTRAVENOUS | Status: AC
  Filled 2016-06-19: qty 100

## 2016-06-19 MED ORDER — DEXAMETHASONE SODIUM PHOSPHATE 10 MG/ML IJ SOLN
INTRAMUSCULAR | Status: AC
  Filled 2016-06-19: qty 1

## 2016-06-19 MED ORDER — LACTATED RINGERS IR SOLN
Status: DC | PRN
  Administered 2016-06-19: 3000 mL

## 2016-06-19 MED ORDER — ROPIVACAINE HCL 5 MG/ML IJ SOLN
INTRAMUSCULAR | Status: AC
  Filled 2016-06-19: qty 30

## 2016-06-19 MED ORDER — EPHEDRINE 5 MG/ML INJ
INTRAVENOUS | Status: AC
  Filled 2016-06-19: qty 10

## 2016-06-19 MED ORDER — HYDROMORPHONE HCL 1 MG/ML IJ SOLN
INTRAMUSCULAR | Status: AC
  Administered 2016-06-19: 0.5 mg via INTRAVENOUS
  Filled 2016-06-19: qty 1

## 2016-06-19 MED ORDER — PROPOFOL 10 MG/ML IV BOLUS
INTRAVENOUS | Status: AC
  Filled 2016-06-19: qty 20

## 2016-06-19 MED ORDER — LACTATED RINGERS IV SOLN
INTRAVENOUS | Status: DC | PRN
  Administered 2016-06-19: 08:00:00 via INTRAVENOUS

## 2016-06-19 MED ORDER — SERTRALINE HCL 50 MG PO TABS
50.0000 mg | ORAL_TABLET | Freq: Every day | ORAL | Status: DC
  Administered 2016-06-19: 50 mg via ORAL
  Filled 2016-06-19 (×2): qty 1

## 2016-06-19 MED ORDER — ARTIFICIAL TEARS OP OINT
TOPICAL_OINTMENT | OPHTHALMIC | Status: DC | PRN
  Administered 2016-06-19: 1 via OPHTHALMIC

## 2016-06-19 MED ORDER — CEFAZOLIN SODIUM-DEXTROSE 2-4 GM/100ML-% IV SOLN
2.0000 g | INTRAVENOUS | Status: AC
  Administered 2016-06-19 (×2): 2 g via INTRAVENOUS

## 2016-06-19 MED ORDER — VASOPRESSIN 20 UNIT/ML IV SOLN
INTRAVENOUS | Status: AC
  Filled 2016-06-19: qty 1

## 2016-06-19 MED ORDER — SODIUM CHLORIDE 0.9 % IJ SOLN
INTRAMUSCULAR | Status: AC
  Filled 2016-06-19: qty 30

## 2016-06-19 MED ORDER — MIDAZOLAM HCL 5 MG/5ML IJ SOLN
INTRAMUSCULAR | Status: DC | PRN
  Administered 2016-06-19: 2 mg via INTRAVENOUS

## 2016-06-19 MED ORDER — AMPHETAMINE-DEXTROAMPHET ER 20 MG PO CP24: 20.0000 mg | ORAL_CAPSULE | Freq: Two times a day (BID) | ORAL | Status: DC

## 2016-06-19 MED ORDER — ALBUTEROL (5 MG/ML) CONTINUOUS INHALATION SOLN
2.5000 mg | INHALATION_SOLUTION | Freq: Four times a day (QID) | RESPIRATORY_TRACT | Status: DC | PRN
  Filled 2016-06-19: qty 20

## 2016-06-19 MED ORDER — BUPROPION HCL ER (XL) 300 MG PO TB24
300.0000 mg | ORAL_TABLET | Freq: Every day | ORAL | Status: DC
  Administered 2016-06-20: 300 mg via ORAL
  Filled 2016-06-19 (×2): qty 1

## 2016-06-19 MED ORDER — BUPIVACAINE HCL (PF) 0.25 % IJ SOLN
INTRAMUSCULAR | Status: DC | PRN
  Administered 2016-06-19: 30 mL

## 2016-06-19 MED ORDER — SUFENTANIL CITRATE 50 MCG/ML IV SOLN
INTRAVENOUS | Status: AC
  Filled 2016-06-19: qty 1

## 2016-06-19 MED ORDER — ROCURONIUM BROMIDE 100 MG/10ML IV SOLN
INTRAVENOUS | Status: DC | PRN
  Administered 2016-06-19 (×3): 10 mg via INTRAVENOUS
  Administered 2016-06-19: 50 mg via INTRAVENOUS
  Administered 2016-06-19: 10 mg via INTRAVENOUS

## 2016-06-19 MED ORDER — FENTANYL CITRATE (PF) 100 MCG/2ML IJ SOLN
INTRAMUSCULAR | Status: DC | PRN
  Administered 2016-06-19: 100 ug via INTRAVENOUS

## 2016-06-19 MED ORDER — LIDOCAINE HCL (CARDIAC) 20 MG/ML IV SOLN
INTRAVENOUS | Status: AC
  Filled 2016-06-19: qty 5

## 2016-06-19 MED ORDER — SUFENTANIL CITRATE 50 MCG/ML IV SOLN
INTRAVENOUS | Status: DC | PRN
Start: 2016-06-19 — End: 2016-06-19
  Administered 2016-06-19: 5 ug via INTRAVENOUS
  Administered 2016-06-19 (×2): 10 ug via INTRAVENOUS
  Administered 2016-06-19: 15 ug via INTRAVENOUS
  Administered 2016-06-19: 10 ug via INTRAVENOUS

## 2016-06-19 MED ORDER — ONDANSETRON HCL 4 MG/2ML IJ SOLN
INTRAMUSCULAR | Status: AC
  Filled 2016-06-19: qty 2

## 2016-06-19 MED ORDER — OXYCODONE-ACETAMINOPHEN 5-325 MG PO TABS
1.0000 | ORAL_TABLET | ORAL | Status: DC | PRN
  Administered 2016-06-19: 2 via ORAL
  Administered 2016-06-20 (×2): 1 via ORAL
  Filled 2016-06-19 (×2): qty 1
  Filled 2016-06-19: qty 2

## 2016-06-19 SURGICAL SUPPLY — 51 items
BARRIER ADHS 3X4 INTERCEED (GAUZE/BANDAGES/DRESSINGS) ×3 IMPLANT
BRR ADH 4X3 ABS CNTRL BYND (GAUZE/BANDAGES/DRESSINGS) ×2
CATH FOLEY 3WAY  5CC 16FR (CATHETERS) ×1
CATH FOLEY 3WAY 5CC 16FR (CATHETERS) ×2 IMPLANT
CELL SAVER LIPIGURD (MISCELLANEOUS) ×2 IMPLANT
CLOTH BEACON ORANGE TIMEOUT ST (SAFETY) ×3 IMPLANT
COVER BACK TABLE 60X90IN (DRAPES) ×6 IMPLANT
DECANTER SPIKE VIAL GLASS SM (MISCELLANEOUS) ×6 IMPLANT
DEFOGGER SCOPE WARMER CLEARIFY (MISCELLANEOUS) ×3 IMPLANT
DEVICE RETRIEVAL ALEXIS 14 (MISCELLANEOUS) IMPLANT
DRSG OPSITE POSTOP 3X4 (GAUZE/BANDAGES/DRESSINGS) ×3 IMPLANT
DURAPREP 26ML APPLICATOR (WOUND CARE) ×3 IMPLANT
ELECT REM PT RETURN 9FT ADLT (ELECTROSURGICAL) ×3
ELECTRODE REM PT RTRN 9FT ADLT (ELECTROSURGICAL) ×2 IMPLANT
EXTRT SYSTEM ALEXIS 14CM (MISCELLANEOUS) ×3
EXTRT SYSTEM ALEXIS 17CM (MISCELLANEOUS)
GAUZE VASELINE 3X9 (GAUZE/BANDAGES/DRESSINGS) ×1 IMPLANT
GLOVE BIO SURGEON STRL SZ 6.5 (GLOVE) ×9 IMPLANT
GLOVE BIOGEL PI IND STRL 7.0 (GLOVE) ×10 IMPLANT
GLOVE BIOGEL PI INDICATOR 7.0 (GLOVE) ×5
GLOVE ECLIPSE 6.5 STRL STRAW (GLOVE) ×9 IMPLANT
KIT ACCESSORY DA VINCI DISP (KITS) ×1
KIT ACCESSORY DVNC DISP (KITS) ×2 IMPLANT
LEGGING LITHOTOMY PAIR STRL (DRAPES) ×3 IMPLANT
LIQUID BAND (GAUZE/BANDAGES/DRESSINGS) ×6 IMPLANT
NDL SPNL 22GX7 QUINCKE BK (NEEDLE) IMPLANT
NEEDLE SPNL 22GX7 QUINCKE BK (NEEDLE) ×3 IMPLANT
PACK ROBOT WH (CUSTOM PROCEDURE TRAY) ×3 IMPLANT
PACK ROBOTIC GOWN (GOWN DISPOSABLE) ×3 IMPLANT
PAD TRENDELENBURG POSITION (MISCELLANEOUS) ×3 IMPLANT
RTRCTR WOUND ALEXIS 18CM SML (INSTRUMENTS)
SAVER CELL AAL HAEMONETICS (INSTRUMENTS) IMPLANT
SET CYSTO W/LG BORE CLAMP LF (SET/KITS/TRAYS/PACK) IMPLANT
SET IRRIG TUBING LAPAROSCOPIC (IRRIGATION / IRRIGATOR) ×3 IMPLANT
SET TRI-LUMEN FLTR TB AIRSEAL (TUBING) ×1 IMPLANT
SUT VIC AB 4-0 PS2 27 (SUTURE) ×6 IMPLANT
SUT VICRYL 0 UR6 27IN ABS (SUTURE) ×6 IMPLANT
SYR 50ML LL SCALE MARK (SYRINGE) ×3 IMPLANT
SYSTEM CONTND EXTRCTN KII BLLN (MISCELLANEOUS) IMPLANT
SYSTEM CONVERTIBLE TROCAR (TROCAR) ×3 IMPLANT
TIP RUMI ORANGE 6.7MMX12CM (TIP) IMPLANT
TIP UTERINE 5.1X6CM LAV DISP (MISCELLANEOUS) IMPLANT
TIP UTERINE 6.7X10CM GRN DISP (MISCELLANEOUS) IMPLANT
TIP UTERINE 6.7X6CM WHT DISP (MISCELLANEOUS) IMPLANT
TIP UTERINE 6.7X8CM BLUE DISP (MISCELLANEOUS) ×1 IMPLANT
TOWEL OR 17X24 6PK STRL BLUE (TOWEL DISPOSABLE) ×6 IMPLANT
TROCAR 12M 150ML BLUNT (TROCAR) ×1 IMPLANT
TROCAR DISP BLADELESS 8 DVNC (TROCAR) ×2 IMPLANT
TROCAR DISP BLADELESS 8MM (TROCAR) ×1
TROCAR PORT AIRSEAL 5X120 (TROCAR) IMPLANT
WATER STERILE IRR 1000ML POUR (IV SOLUTION) ×3 IMPLANT

## 2016-06-19 NOTE — Transfer of Care (Signed)
Immediate Anesthesia Transfer of Care Note  Patient: Sonya Novak  Procedure(s) Performed: Procedure(s): ROBOTIC ASSISTED SUPRACERVICAL HYSTERECTOMY (N/A) BILATERAL SALPINGECTOMY (Bilateral)  Patient Location: PACU  Anesthesia Type:General  Level of Consciousness: awake  Airway & Oxygen Therapy: Patient Spontanous Breathing and Patient connected to nasal cannula oxygen  Post-op Assessment: Report given to RN and Post -op Vital signs reviewed and stable  Post vital signs: stable  Last Vitals:  Vitals:   06/19/16 0604  BP: 127/83  Pulse: 71  Resp: 16  Temp: 36.6 C    Last Pain:  Vitals:   06/19/16 0604  TempSrc: Oral         Complications: No apparent anesthesia complications

## 2016-06-19 NOTE — H&P (Signed)
Sonya Novak is an 45 y.o. 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    Past Medical History:  Diagnosis Date  . Anxiety   . Asthma    related to allergies- occ inhaler use  . Depression   . Family history of adverse reaction to anesthesia    mother PONV  . Recurrent upper respiratory infection (URI)    allergy related    Past Surgical History:  Procedure Laterality Date  . ROBOT ASSISTED MYOMECTOMY  05/21/2011   Procedure: ROBOTIC ASSISTED MYOMECTOMY;  Surgeon: Princess Bruins;  Location: [facility];  Service: Gynecology;  Laterality: N/A;  . TONSILLECTOMY      No family history on file.  Social History:  reports that she has quit smoking. Her smoking use included Cigarettes. She has quit using smokeless tobacco. She reports that she drinks about 0.6 oz of alcohol per week . She reports that she does not use drugs.  Allergies: No Known Allergies  Prescriptions Prior to Admission  Medication Sig Dispense Refill Last Dose  . albuterol (PROVENTIL HFA;VENTOLIN HFA) 108 (90 Base) MCG/ACT inhaler Inhale 1 puff into the lungs every 6 (six) hours as needed for wheezing or shortness of breath.   Past Week at Unknown time  . amphetamine-dextroamphetamine (ADDERALL XR) 20 MG 24 hr capsule Take 20 mg by mouth 2 (two) times daily.   Past Week at Unknown time  . buPROPion (WELLBUTRIN XL) 300 MG 24 hr tablet Take 1 tablet (300 mg total) by mouth daily. For depression. 30 tablet 0 06/19/2016 at 0430  . cetirizine (ZYRTEC) 10 MG tablet Take 10 mg by mouth daily.   06/19/2016 at 0430  . hydrOXYzine (ATARAX/VISTARIL)  25 MG tablet Take 25 mg by mouth 3 (three) times daily as needed for anxiety.   06/19/2016 at 0430  . sertraline (ZOLOFT) 50 MG tablet Take 50 mg by mouth at bedtime.   06/18/2016 at Unknown time  . busPIRone (BUSPAR) 7.5 MG tablet Take one tablet (7.5mg ) by mouth twice a day for anxiety. (Patient not taking: Reported on 06/03/2016) 60 tablet 0 Not Taking at Unknown time  . fluticasone (FLONASE) 50 MCG/ACT nasal spray Place 1 spray into both nostrils daily.   More than a month at Unknown time    ROS Neg  Blood pressure 127/83, pulse 71, temperature 97.8 F (36.6 C), temperature source Oral, resp. rate 16, last menstrual period 06/01/2016, SpO2 100 %. Physical Exam  Results for orders placed or performed during the hospital encounter of 06/19/16 (from the past 24 hour(s))  Pregnancy, urine     Status: None   Collection Time: 06/19/16  6:00 AM  Result Value Ref Range   Preg Test, Ur NEGATIVE NEGATIVE   Pelvic US:  Uterine myomas with largest pedunculated at 6 and 10 cm.  IM myomas 2-3 cm.  Ovaries wnl.  Assessment/Plan: Sxic uterine myomas for Robotic Supracervical Laparoscopic Hysterectomy with Bilateral Salpingectomy.  Extraction bag.  Surgery and risks reviewed.  Princess Bruins MD  06/19/2016 at 7:14 am  [name],MARIE-LYNE 06/19/2016, 7:10 AM

## 2016-06-19 NOTE — Op Note (Signed)
06/19/2016  12:30 PM  PATIENT:  Sonya Novak  45 y.o. female  PRE-OPERATIVE DIAGNOSIS:  Uterine Fibroids, Pelvic Pain  POST-OPERATIVE DIAGNOSIS:  UTERINE FIBROIDS, PELVIC PAIN  PROCEDURE:  Procedure(s): ROBOTIC ASSISTED SUPRACERVICAL HYSTERECTOMY BILATERAL SALPINGECTOMY, MYOMECTOMIES X 2  SURGEON:  Surgeon(s): Princess Bruins, MD  ASSISTANTS: Hester Mates   ANESTHESIA:   general   PROCEDURE:  Under general anesthesia with endotracheal intubation. The patient is in lithotomy position. She was prepped with ChloraPrep on the abdomen and with Betadine on the suprapubic, vulvar and vaginal areas.  A Foley is put in place in the bladder.  A #8 Rumi with a small Koh ring are put in place in the uterus and cervix.  The speculum is removed. Abdominally, we infiltrated the supraumbilical area with Marcaine on quarter plain and make a 1.5 cm incision at that level.  The aponeurosis was grasped with Coker's and opened with Mayo scissors under direct vision. The parietal peritoneum was opened bluntly with the finger. A pursestring stitch of Vicryl 0 was done on the aponeurosis. The Sheryle Hail was inserted at that level. Under direct vision and pneumoperitoneum was created with CO2.  Inspection of the abdominopelvic cavities revealed no adhesion with the anterior wall.  A semicircular configuration was used for port placement with 2 robotic ports on the right side and 1 robotic port on the left lower side and a 5 mm assistant port on the upper left side.  Infiltration with Marcaine one quarter plain was done at each site and small incisions were made with the scalpel.  All ports were inserted under direct vision.  The patient was placed in deep Trendelenburg and the robot was docked on the right side.   The robotic instruments were inserted under direct vision with the Endo Shears scissors in the first arm, the PK in the second arm and the prograsp in the third arm.  We went to the console.      Inspection  of the abdominopelvic cavities revealed a normal liver, normal appendix and no bowel adhesions.  The uterus was myomatous with normal tubes and 2 normal ovaries.  The largest myomas which had been described by ultrasound as being pedunculated were actually separate from the uterus and attached to the right abdominopelvic wall.  The largest one, about 10 cm, was attached to the right abdominal wall, close to the anterior wall.  The other myoma was about 6 cm and attached to the right pelvic wall over the area close to the right infundibulopelvic ligament, ureter and iliac vessels.  The 2 myomas were infiltrated with vasopressin at about 2 cm from the base of the pedicle attaching them to the abdominal and pelvic walls.  The robotic instruments were switched to put a tenaculum in the first arm and use the PK in the second arm with the Endo Shears scissors in the third arm.  A circumferential incision was made on the largest myoma at the site of Pitressin infiltration using the PK and then the tip of the Endo Shears scissors, safely detaching the pedicle/adhesion linking it to the wall.  Blood vessels were safely cauterized with the PK to assure good hemostasis.  The fibroid was put in the left gutter.  We proceeded the same way with the smaller fibroid.  The right ureter was well visualized with good peristalsis away from the site of surgery.  After making the circumferential incision on the myoma at the site of Pitressin infiltration, the pedicle/adhesion was peeled off of  the myoma meticulously, making sure that hemostasis was maintained at all times by cauterizing the blood vessels with the PK.  That fibroid was put in the left gutter with the other one.  Good peristalsis of the right ureter was confirmed after removal of the myoma and hemostasis was adequate.  A 1 cm myoma was seen attached to the bowels by an adhesion.  The adhesion was cauterized and sectioned and the myoma was removed through the assistant port  after sectioning it to make its diameter smaller. We then proceeded with the supracervical hysterectomy with bilateral salpingectomy.  The instruments were switched back to the Endo Shears scissors in the first arm, the PK in the second arm and the Prograsp in the third. The left ureter was visualized with good peristalsis in normal anatomy position.  We started at the left round ligament, cauterizing and sectioning it.  We then cauterized and sectioned the left mesosalpinx. We cauterized and sectioned the left utero-ovarian ligament.  We opened the anterior visceral peritoneum to the midline.  We proceeded exactly the same way on the right side.  We then completely opened the visceral peritoneum anteriorly and lowered the bladder past the [facility] ring.  We lifted the anterior myoma to have better exposure of the right uterine artery.  We then cauterized the right uterine artery just at the junction of the uterus and cervix and sectioned it.  We cauterized and sectioned the left uterine artery at the same level.  We then used the tip of the Endo Shears scissors to section the uterus off of the cervix just above the uterosacral ligaments.  The uterus with both tubes was put in the left gutter.  The Koh ring was pulled just at the entrance of the exocervix.  We then completed hemostasis on the top of the cervix.  The PK was used to cauterize the endocervical canal to lower the risk of future bleeding.  Hemostasis was adequate at all levels.  Both ureters showed good peristalsis.  The urine was clear.  The robotic instruments were removed.  The robot was undocked.  We went to laparoscopy time.      We used the extraction bag to remove the specimen from the abdominopelvic cavities.  We were able to put the uterus and one of the myoma in the bag.  That specimen was removed intact through the bag at the supraumbilical incision using the semicircular technique with the scalpel, pulling it has a cylinder.  We then returned  with the extraction bag and put the second myoma in the bag and extracted the same way. All specimens were sent together to pathology.  We went back with laparoscopy confirmed good hemostasis. We irrigated and suctioned the abdominopelvic cavities.  Bupivacaine was poured in the abdominopelvic cavities.  We put Interceed at the cervix and on the right lateral wall where the myomas were removed.  All laparoscopy instruments were removed.  The CO2 was evacuated.  We closed the supraumbilical incision by putting in place a new pursestring stitch of Vicryl 0 and attaching it. All incisions were closed with a subcuticular suture of Vicryl 4-0. Dermabond was added on all incisions.  The Koh ring and Roomi were removed previously from the vagina.  The patient was brought to recovery room in good and stable status.  ESTIMATED BLOOD LOSS: 150 cc   Intake/Output Summary (Last 24 hours) at 06/19/16 1230 Last data filed at 06/19/16 1224  Gross per 24 hour  Intake  2600 ml  Output              175 ml  Net             2425 ml     BLOOD ADMINISTERED:none   LOCAL MEDICATIONS USED:  MARCAINE    and BUPIVICAINE   SPECIMEN:  Source of Specimen:  Uterus without cervix, bilateral tubs and Myomas  DISPOSITION OF SPECIMEN:  PATHOLOGY  COUNTS:  YES  PLAN OF CARE: Transfer to PACU  Princess Bruins MD  06/19/2016 at 12:30 PM

## 2016-06-19 NOTE — Anesthesia Postprocedure Evaluation (Signed)
Anesthesia Post Note  Patient: Sonya Novak  Procedure(s) Performed: Procedure(s) (LRB): ROBOTIC ASSISTED SUPRACERVICAL HYSTERECTOMY (N/A) BILATERAL SALPINGECTOMY (Bilateral)  Patient location during evaluation: [facility] Anesthesia Type: General Level of consciousness: awake and alert and oriented Pain management: pain level controlled Vital Signs Assessment: post-procedure vital signs reviewed and stable Respiratory status: spontaneous breathing, nonlabored ventilation, respiratory function stable and patient connected to nasal cannula oxygen Cardiovascular status: stable Postop Assessment: adequate PO intake and no signs of nausea or vomiting Anesthetic complications: no Comments: Patient stated that her legs felt sore, but pain improved with ambulation and meds, now much better.     Last Vitals:  Vitals:   06/19/16 1430 06/19/16 1536  BP: (!) 141/74 122/75  Pulse: 88 87  Resp: 16 16  Temp:  36.7 C    Last Pain:  Vitals:   06/19/16 1536  TempSrc: Oral  PainSc:    Pain Goal:                 Willa Rough

## 2016-06-19 NOTE — Anesthesia Procedure Notes (Signed)
Procedure Name: Intubation Date/Time: 06/19/2016 7:37 AM Performed by: Ignacia Bayley Pre-anesthesia Checklist: Patient identified, Emergency Drugs available, Suction available and Patient being monitored Patient Re-evaluated:Patient Re-evaluated prior to inductionOxygen Delivery Method: Circle system utilized Preoxygenation: Pre-oxygenation with 100% oxygen Intubation Type: IV induction Ventilation: Mask ventilation without difficulty Laryngoscope Size: Miller and 2 Grade View: Grade II Tube type: Oral Tube size: 7.0 mm Number of attempts: 1 Airway Equipment and Method: Stylet Placement Confirmation: ETT inserted through vocal cords under direct vision,  positive ETCO2 and breath sounds checked- equal and bilateral Secured at: 20 cm Tube secured with: Tape Dental Injury: Teeth and Oropharynx as per pre-operative assessment

## 2016-06-19 NOTE — OR Nursing (Signed)
SPOKE WITH PT'S MOTHER AT [phone] WITH UPDATE.

## 2016-06-20 ENCOUNTER — Encounter (HOSPITAL_COMMUNITY): Payer: Self-pay | Admitting: Obstetrics & Gynecology

## 2016-06-20 DIAGNOSIS — D251 Intramural leiomyoma of uterus: Secondary | ICD-10-CM | POA: Diagnosis not present

## 2016-06-20 LAB — CBC
HCT: 35.5 % — ABNORMAL LOW (ref 36.0–46.0)
Hemoglobin: 12.1 g/dL (ref 12.0–15.0)
MCH: 31.8 pg (ref 26.0–34.0)
MCHC: 34.1 g/dL (ref 30.0–36.0)
MCV: 93.2 fL (ref 78.0–100.0)
Platelets: 294 10*3/uL (ref 150–400)
RBC: 3.81 MIL/uL — ABNORMAL LOW (ref 3.87–5.11)
RDW: 13.4 % (ref 11.5–15.5)
WBC: 12.6 10*3/uL — ABNORMAL HIGH (ref 4.0–10.5)

## 2016-06-20 MED ORDER — OXYCODONE-ACETAMINOPHEN 7.5-325 MG PO TABS: 1.0000 | ORAL_TABLET | ORAL | 0 refills | Status: DC | PRN

## 2016-06-20 NOTE — Discharge Instructions (Signed)
Supracervical Hysterectomy, Care After Refer to this sheet in the next few weeks. These instructions provide you with information on caring for yourself after your procedure. Your health care provider may also give you more specific instructions. Your treatment has been planned according to current medical practices, but problems sometimes occur. Call your health care provider if you have any problems or questions after your procedure.  WHAT TO EXPECT AFTER THE PROCEDURE After your procedure, it is typical to have some discomfort, tenderness, swelling, and bruising at the surgical sites. This normally lasts for about 2 weeks.  HOME CARE INSTRUCTIONS   Get plenty of rest and sleep.  Only take over-the-counter or prescription medicines as directed by your health care provider.  Do not take aspirin. It can cause bleeding.  Do not drive until your health care provider approves.  Follow your health care provider's advice regarding exercise, lifting, and general activities.  Resume your usual diet as directed by your health care provider.  Do not douche, use tampons, or have sexual intercourse for at least 6 weeks or until your health care provider gives you permission.  Change your bandages (dressings) only as directed by your health care provider.  Monitor your temperature.  Take showers instead of baths for 2-3 weeks or as directed by your health care provider.  Drink enough fluids to keep your urine clear or pale yellow.  Do not drink alcohol until your health care provider gives you permission.  If you are constipated, you may take a mild laxative if your health care provider approves. Bran foods may also help with constipation problems.  Try to have someone home with you for 1-2 weeks to help with activities.  Follow up with your health care provider as directed. SEEK MEDICAL CARE IF:  You have swelling, redness, or increasing pain in the incision area.  You have pus coming  from an incision.  You notice a bad smell coming from the incision or dressing.  You have swelling, redness, or pain in the area around the IV site.  Your incision breaks open.  You feel dizzy or lightheaded.  You have pain or bleeding when you urinate.  You have persistent diarrhea.  You have persistent nausea and vomiting.  You have abnormal vaginal discharge.  You have a rash.  Your pain is not controlled with your prescribed medicine. SEEK IMMEDIATE MEDICAL CARE IF:  You have a fever.  You have severe abdominal pain.  You have chest pain.  You have shortness of breath.  You faint.  You have pain, swelling, or redness in your leg.  You have heavy vaginal bleeding with blood clots.   This information is not intended to replace advice given to you by your health care provider. Make sure you discuss any questions you have with your health care provider.   Document Released: 08/10/2013 Document Reviewed: 08/10/2013 Elsevier Interactive Patient Education [employer].

## 2016-06-20 NOTE — Progress Notes (Signed)
1 Day Post-Op Procedure(s) (LRB): ROBOTIC ASSISTED SUPRACERVICAL HYSTERECTOMY (N/A) BILATERAL SALPINGECTOMY (Bilateral)  Subjective: Patient reports that pain is well managed.  Tolerating normal diet as tolerated  diet without difficulty. No nausea / vomiting.  Ambulating and voiding.  Objective: BP 126/76 (BP Location: Left Arm)   Pulse 69   Temp 98.2 F (36.8 C) (Axillary)   Resp 18   LMP 06/01/2016   SpO2 98%  Lungs: clear Heart: normal rate and rhythm Abdomen:soft and appropriately tender Extremities: Homans sign is negative, no sign of DVT Incision: healing well  Results for Sonya Novak, Sonya Novak (MRN [id number]) as of 06/20/2016 13:13  Ref. Range 06/20/2016 05:15  WBC Latest Ref Range: 4.0 - 10.5 K/uL 12.6 (H)  RBC Latest Ref Range: 3.87 - 5.11 MIL/uL 3.81 (L)  Hemoglobin Latest Ref Range: 12.0 - 15.0 g/dL 12.1  HCT Latest Ref Range: 36.0 - 46.0 % 35.5 (L)  MCV Latest Ref Range: 78.0 - 100.0 fL 93.2  MCH Latest Ref Range: 26.0 - 34.0 pg 31.8  MCHC Latest Ref Range: 30.0 - 36.0 g/dL 34.1  RDW Latest Ref Range: 11.5 - 15.5 % 13.4  Platelets Latest Ref Range: 150 - 400 K/uL 294    Assessment: s/p Procedure(s): ROBOTIC ASSISTED SUPRACERVICAL HYSTERECTOMY BILATERAL SALPINGECTOMY: progressing well  Plan: Discharge home  LOS: 0 days    Sonya Novak,Sonya Novak 06/20/2016, 12:52 PM

## 2016-06-20 NOTE — Progress Notes (Signed)
Pt discharged to home with mother.  Condition stable.  Pt ambulated to car with Gareth Eagle, NT.  No equipment for home ordered at discharge.

## 2016-06-20 NOTE — Discharge Summary (Signed)
  Physician Discharge Summary  Patient ID: Sonya Novak MRN: [id number] DOB/AGE: 47-Mar-1972 46 y.o.  Admit date: 06/19/2016 Discharge date: 06/20/2016  Admission Diagnoses: Uterine Fibroids, Pelvic Pain  Discharge Diagnoses: Uterine Fibroids, Pelvic Pain        Active Problems:   Postoperative state   Discharged Condition: good  Hospital Course:  good  Consults: None  Treatments: surgery: Robotic Supracervical Hysterectomy with Bilateral Salpingectomy and Myomectomies   Disposition: 01-Home or [place], MD Follow up in 3 week(s).   Specialty:  Obstetrics and Gynecology Contact information: [place] [address] [phone]           Signed: Princess Bruins, MD 06/20/2016, 2:28 PM

## 2016-10-29 ENCOUNTER — Encounter: Payer: Self-pay | Admitting: Podiatry

## 2016-10-29 ENCOUNTER — Ambulatory Visit (INDEPENDENT_AMBULATORY_CARE_PROVIDER_SITE_OTHER): Payer: Managed Care, Other (non HMO) | Admitting: Podiatry

## 2016-10-29 VITALS — BP 133/87 | HR 76 | Resp 14 | Ht 63.0 in | Wt 155.0 lb

## 2016-10-29 DIAGNOSIS — M2042 Other hammer toe(s) (acquired), left foot: Secondary | ICD-10-CM | POA: Diagnosis not present

## 2016-10-29 NOTE — Progress Notes (Signed)
   Subjective:    Patient ID: Sonya Novak, female    DOB: Mar 28, 1971, 45 y.o.   MRN: [id number]  HPI this patient presents to the office with chief complaint of a painful second toe left foot. She says her pain is on and off for the last 2 months. She states that her toe has become swollen over that time. She presents the office today, not having any pain or discomfort of the second toe. She denies any history of trauma or injury to the foot. She has provided for him. No self treatment nor sought any professional help. She presents the office for an evaluation and treatment    Review of Systems  All other systems reviewed and are negative.      Objective:   Physical Exam GENERAL APPEARANCE: Alert, conversant. Appropriately groomed. No acute distress.  VASCULAR: Pedal pulses are  palpable at  [facility] and PT bilateral.  Capillary refill time is immediate to all digits,  Normal temperature gradient.  Digital hair growth is present bilateral  NEUROLOGIC: sensation is normal to 5.07 monofilament at 5/5 sites bilateral.  Light touch is intact bilateral, Muscle strength normal.  MUSCULOSKELETAL: acceptable muscle strength, tone and stability bilateral.  Intrinsic muscluature intact bilateral.  Rectus appearance of foot and digits noted bilateral. Hammer toe with skin lesion PIPJ second toe left foot.  DERMATOLOGIC: skin color, texture, and turgor are within normal limits.  No preulcerative lesions or ulcers  are seen, no interdigital maceration noted.  No open lesions present.  Digital nails are asymptomatic. No drainage noted.         Assessment & Plan:  Hammer toe left foot   IE.  Evaluation of this patient revealed a hammertoe of the second toe left foot. I explained to the patient that this is aggravated when she wears tight shoes. I told her to monitor her footgear and determine which shoes aggravate the condition. She was also given padding that she can place on her second toe and  weight-bear in an effort to eliminate her pain returns the office when necessary   Gardiner Barefoot DPM

## 2017-05-26 DIAGNOSIS — J309 Allergic rhinitis, unspecified: Secondary | ICD-10-CM | POA: Insufficient documentation

## 2017-12-04 ENCOUNTER — Ambulatory Visit (INDEPENDENT_AMBULATORY_CARE_PROVIDER_SITE_OTHER): Payer: Managed Care, Other (non HMO) | Admitting: Obstetrics & Gynecology

## 2017-12-04 ENCOUNTER — Encounter: Payer: Self-pay | Admitting: Obstetrics & Gynecology

## 2017-12-04 VITALS — BP 130/82

## 2017-12-04 DIAGNOSIS — M25552 Pain in left hip: Secondary | ICD-10-CM

## 2017-12-04 DIAGNOSIS — G8929 Other chronic pain: Secondary | ICD-10-CM | POA: Diagnosis not present

## 2017-12-04 NOTE — Progress Notes (Signed)
    Sonya Novak [birth year] [id number]        47 y.o.  G1P0010   RP: Left hip/upper thigh pain x several months  HPI: S/P Robotic Supracervical Hysterectomy with bilateral salpingectomy and right abdominopelvic wall Myomectomies (2) 06/19/2016 with me.  Pain is aggravated by ambulation, but can happen even at rest.  Patient has been taking ibuprofen which controls the pain.  No neurologic symptoms in the left lower limb.  Normal strength of the left lower limb.  No pain with intercourse.  Urine and bowel movements normal.   OB History  Gravida Para Term Preterm AB Living  1 0     1 0  SAB TAB Ectopic Multiple Live Births               # Outcome Date GA Lbr Len/2nd Weight Sex Delivery Anes PTL Lv  1 AB               Past medical history,surgical history, problem list, medications, allergies, family history and social history were all reviewed and documented in the EPIC chart.   Directed ROS with pertinent positives and negatives documented in the history of present illness/assessment and plan.  Exam:  Vitals:   12/04/17 1227  BP: 130/82   General appearance:  Normal  Abdomen: Soft, nontender, nondistended.  Gynecologic exam: Status post supracervical hysterectomy with bilateral salpingectomy and abdominopelvic wall myomectomies.  Vulva normal.  Vagina and cervix normal.  No pelvic mass felt.  Nontender.   Assessment/Plan:  47 y.o. G1P0010   1. Chronic left hip pain Status post supra cervical hysterectomy and bilateral salpingectomy with left abdominal-pelvic wall myomectomies.  No evidence of pelvic pathology.  Gynecologic exam nontender.  Left hip pain and upper left thigh pain worsened by ambulation.  Decision to refer to orthopedic surgeon for investigation and management.  Patient agrees with plan.  Counseling on above issues more than 50% for 25 minutes.  Princess Bruins MD, 12:42 PM 12/04/2017

## 2017-12-08 ENCOUNTER — Encounter: Payer: Self-pay | Admitting: Obstetrics & Gynecology

## 2017-12-08 NOTE — Patient Instructions (Signed)
1. Chronic left hip pain Status post supra cervical hysterectomy and bilateral salpingectomy with left abdominal-pelvic wall myomectomies.  No evidence of pelvic pathology.  Gynecologic exam nontender.  Left hip pain and upper left thigh pain worsened by ambulation.  Decision to refer to orthopedic surgeon for investigation and management.  Patient agrees with plan.  Sonya Novak, it was a pleasure seeing you today!

## 2017-12-10 ENCOUNTER — Telehealth: Payer: Self-pay | Admitting: *Deleted

## 2017-12-10 NOTE — Telephone Encounter (Signed)
Pt scheduled on 12/14/17 @ 2:30pm with Dr.Xu Zachery Conch) pt informed.

## 2017-12-10 NOTE — Telephone Encounter (Signed)
-----   Message from Princess Bruins, MD sent at 12/04/2017 12:59 PM EST ----- Regarding: Refer to Orthopedist Left hip/upper thigh pain.  Left hip MRI.

## 2017-12-14 ENCOUNTER — Ambulatory Visit (INDEPENDENT_AMBULATORY_CARE_PROVIDER_SITE_OTHER): Payer: Managed Care, Other (non HMO) | Admitting: Orthopaedic Surgery

## 2017-12-14 ENCOUNTER — Encounter (INDEPENDENT_AMBULATORY_CARE_PROVIDER_SITE_OTHER): Payer: Self-pay | Admitting: Orthopaedic Surgery

## 2017-12-14 ENCOUNTER — Ambulatory Visit (INDEPENDENT_AMBULATORY_CARE_PROVIDER_SITE_OTHER): Payer: Self-pay

## 2017-12-14 ENCOUNTER — Ambulatory Visit (INDEPENDENT_AMBULATORY_CARE_PROVIDER_SITE_OTHER): Payer: Managed Care, Other (non HMO)

## 2017-12-14 DIAGNOSIS — M25552 Pain in left hip: Secondary | ICD-10-CM | POA: Diagnosis not present

## 2017-12-14 NOTE — Progress Notes (Signed)
Office Visit Note   Patient: Sonya Novak           Date of Birth: [birth year]           MRN: [id number] Visit Date: 12/14/2017              Requested by: Mayra Neer, MD [address] [place], [place] [address] PCP: Mayra Neer, MD   Assessment & Plan: Visit Diagnoses:  1. Pain in left hip     Plan: Impression is left hip osteoarthritis.  I recommend referral to Dr. Ernestina Patches for steroid injection to see how she responds to this.  Follow-up in 4 weeks for recheck.  Patient has been taking Advil with partial relief.  Follow-Up Instructions: Return in about 4 weeks (around 01/11/2018).   Orders:  Orders Placed This Encounter  Procedures  . XR HIP UNILAT W OR W/O PELVIS 2-3 VIEWS LEFT   No orders of the defined types were placed in this encounter.     Procedures: Large Joint Inj: L hip joint on 12/14/2017 3:54 PM Indications: pain and diagnostic evaluation Details: 22 G needle, anterior approach  Arthrogram: Yes  Medications: 80 mg triamcinolone acetonide 40 MG/ML; 3 mL bupivacaine 0.5 % Outcome: tolerated well, no immediate complications  Arthrogram demonstrated excellent flow of contrast throughout the joint surface without extravasation or obvious defect.  The patient had relief of symptoms during the anesthetic phase of the injection.  Procedure, treatment alternatives, risks and benefits explained, specific risks discussed. Consent was given by the patient. Immediately prior to procedure a time out was called to verify the correct patient, procedure, equipment, support staff and site/side marked as required. Patient was prepped and draped in the usual sterile fashion.       Clinical Data: No additional findings.   Subjective: Chief Complaint  Patient presents with  . Left Hip - Pain    Patient is a 47 year old female comes in with chronic left hip and groin pain.  This radiates down the inner thigh into the left knee.  She denies any  radicular symptoms.  Denies any back pain.  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  She does limp as a result of the pain.  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    Review of Systems  Constitutional: Negative.   HENT: Negative.   Eyes: Negative.   Respiratory: Negative.   Cardiovascular: Negative.   Endocrine: Negative.   Musculoskeletal: Negative.   Neurological: Negative.   Hematological: Negative.   Psychiatric/Behavioral: Negative.   All other systems reviewed and are negative.    Objective: Vital Signs: LMP 06/01/2016   Physical Exam  Constitutional: She is oriented to person, place, and time. She appears well-developed and well-nourished.  HENT:  Head: Normocephalic and atraumatic.  Eyes: EOM are normal.  Neck: Neck supple.  Pulmonary/Chest: Effort normal.  Abdominal: Soft.  Neurological: She is alert and oriented to person, place, and time.  Skin: Skin is warm. Capillary refill takes less than 2 seconds.  Psychiatric: She has a normal mood and affect. Her behavior is normal. Judgment and thought content normal.  Nursing note and vitals reviewed.   Ortho Exam Left hip exam shows discomfort with internal and external rotation and logroll.  Negative Stinchfield sign.  Trochanteric bursa is mildly tender.  Negative straight leg. Specialty Comments:  No specialty comments available.  Imaging: Xr Hip Unilat W Or W/o Pelvis  2-3 Views Left  Result Date: 12/14/2017 No acute or structural abnormalities.  Mild osteoarthritis and slight irregularity of superior acetabulum with subchondral sclerosis    PMFS History: Patient Active Problem List   Diagnosis Date Noted  . Postoperative state 06/19/2016  . MDD (major depressive disorder), recurrent severe, without psychosis ([place]) 09/14/2013  . Suicidal ideation 09/14/2013  . ADHD (attention deficit hyperactivity disorder) 09/14/2013   Past Medical History:    Diagnosis Date  . Anxiety   . Asthma    related to allergies- occ inhaler use  . Depression   . Family history of adverse reaction to anesthesia    mother PONV  . Recurrent upper respiratory infection (URI)    allergy related    Family History  Problem Relation Age of Onset  . Hypertension Mother   . Diabetes Brother   . Diabetes Maternal Uncle   . Diabetes Maternal Grandfather     Past Surgical History:  Procedure Laterality Date  . BILATERAL SALPINGECTOMY Bilateral 06/19/2016   Procedure: BILATERAL SALPINGECTOMY;  Surgeon: Princess Bruins, MD;  Location: [facility];  Service: Gynecology;  Laterality: Bilateral;  . ROBOT ASSISTED MYOMECTOMY  05/21/2011   Procedure: ROBOTIC ASSISTED MYOMECTOMY;  Surgeon: Princess Bruins;  Location: [facility];  Service: Gynecology;  Laterality: N/A;  . ROBOTIC ASSISTED SUPRACERVICAL HYSTERECTOMY N/A 06/19/2016   Procedure: ROBOTIC ASSISTED SUPRACERVICAL HYSTERECTOMY;  Surgeon: Princess Bruins, MD;  Location: [facility];  Service: Gynecology;  Laterality: N/A;  . TONSILLECTOMY     Social History   Occupational History  . Not on file  Tobacco Use  . Smoking status: Former Smoker    Types: Cigarettes  . Smokeless tobacco: Former Network engineer and Sexual Activity  . Alcohol use: Yes    Alcohol/week: 0.6 oz    Types: 1 Glasses of wine per week    Comment: 2 drinks a week   . Drug use: No  . Sexual activity: Yes    Partners: Male    Birth control/protection: None    Comment: 1st intercourse- 68, partners- ?,

## 2017-12-15 MED ORDER — BUPIVACAINE HCL 0.5 % IJ SOLN
3.0000 mL | INTRAMUSCULAR | Status: AC | PRN
  Administered 2017-12-14: 3 mL via INTRA_ARTICULAR

## 2017-12-15 MED ORDER — TRIAMCINOLONE ACETONIDE 40 MG/ML IJ SUSP
80.0000 mg | INTRAMUSCULAR | Status: AC | PRN
  Administered 2017-12-14: 80 mg via INTRA_ARTICULAR

## 2018-01-11 ENCOUNTER — Ambulatory Visit (INDEPENDENT_AMBULATORY_CARE_PROVIDER_SITE_OTHER): Payer: Managed Care, Other (non HMO) | Admitting: Orthopaedic Surgery

## 2018-01-11 DIAGNOSIS — M1612 Unilateral primary osteoarthritis, left hip: Secondary | ICD-10-CM

## 2018-01-11 NOTE — Progress Notes (Signed)
Office Visit Note   Patient: Sonya Novak           Date of Birth: 1971-05-11           MRN: [id number] Visit Date: 01/11/2018              Requested by: Mayra Neer, MD [address] [place], [place] [address] PCP: Mayra Neer, MD   Assessment & Plan: Visit Diagnoses:  1. Primary osteoarthritis of left hip     Plan: Patient has had significant and full relief from the left hip injection.  Patient has mild osteoarthritis.  We discussed avoidance of impact activities.  Home exercise program was given today.  Questions encouraged and answered.  Follow-up as needed.  Follow-Up Instructions: Return if symptoms worsen or fail to improve.   Orders:  No orders of the defined types were placed in this encounter.  No orders of the defined types were placed in this encounter.     Procedures: No procedures performed   Clinical Data: No additional findings.   Subjective: Chief Complaint  Patient presents with  . Left Hip - Pain, Follow-up    Patient follows up today for her left hip injection.  She is feeling 100% better.  She no longer has any pain.  Overall she is doing really well.  She had the left hip injection with Dr. Ernestina Patches about a month ago.    Review of Systems   Objective: Vital Signs: LMP 06/01/2016   Physical Exam  Ortho Exam Left hip exam is benign.  She is ambulating with a normal gait at this point. Specialty Comments:  No specialty comments available.  Imaging: No results found.   PMFS History: Patient Active Problem List   Diagnosis Date Noted  . Postoperative state 06/19/2016  . MDD (major depressive disorder), recurrent severe, without psychosis ([place]) 09/14/2013  . Suicidal ideation 09/14/2013  . ADHD (attention deficit hyperactivity disorder) 09/14/2013   Past Medical History:  Diagnosis Date  . Anxiety   . Asthma    related to allergies- occ inhaler use  . Depression   . Family history of adverse reaction  to anesthesia    mother PONV  . Recurrent upper respiratory infection (URI)    allergy related    Family History  Problem Relation Age of Onset  . Hypertension Mother   . Diabetes Brother   . Diabetes Maternal Uncle   . Diabetes Maternal Grandfather     Past Surgical History:  Procedure Laterality Date  . BILATERAL SALPINGECTOMY Bilateral 06/19/2016   Procedure: BILATERAL SALPINGECTOMY;  Surgeon: Princess Bruins, MD;  Location: [facility];  Service: Gynecology;  Laterality: Bilateral;  . ROBOT ASSISTED MYOMECTOMY  05/21/2011   Procedure: ROBOTIC ASSISTED MYOMECTOMY;  Surgeon: Princess Bruins;  Location: [facility];  Service: Gynecology;  Laterality: N/A;  . ROBOTIC ASSISTED SUPRACERVICAL HYSTERECTOMY N/A 06/19/2016   Procedure: ROBOTIC ASSISTED SUPRACERVICAL HYSTERECTOMY;  Surgeon: Princess Bruins, MD;  Location: [facility];  Service: Gynecology;  Laterality: N/A;  . TONSILLECTOMY     Social History   Occupational History  . Not on file  Tobacco Use  . Smoking status: Former Smoker    Types: Cigarettes  . Smokeless tobacco: Former Network engineer and Sexual Activity  . Alcohol use: Yes    Alcohol/week: 0.6 oz    Types: 1 Glasses of wine per week    Comment: 2 drinks a week   . Drug use: No  . Sexual activity: Yes  Partners: Male    Birth control/protection: None    Comment: 1st intercourse- 32, partners- ?,

## 2018-04-28 ENCOUNTER — Telehealth (INDEPENDENT_AMBULATORY_CARE_PROVIDER_SITE_OTHER): Payer: Self-pay | Admitting: Orthopaedic Surgery

## 2018-04-28 NOTE — Telephone Encounter (Signed)
Patient called asked if she can be set up for an MRI. Patient said the injection lasted from February to May. The number to contact patient is [phone]

## 2018-04-29 ENCOUNTER — Other Ambulatory Visit (INDEPENDENT_AMBULATORY_CARE_PROVIDER_SITE_OTHER): Payer: Self-pay

## 2018-04-29 DIAGNOSIS — M1612 Unilateral primary osteoarthritis, left hip: Secondary | ICD-10-CM

## 2018-04-29 NOTE — Telephone Encounter (Signed)
Called Sonya Novak to advise order was made and they will contact her to let her know when and where the MRI will be.

## 2018-04-29 NOTE — Telephone Encounter (Signed)
Yes.  MR Arthrogram left hip r/o structural abnl

## 2018-04-29 NOTE — Telephone Encounter (Signed)
Order made someone will call her to schedule appt

## 2018-04-29 NOTE — Telephone Encounter (Signed)
See message.

## 2018-05-27 ENCOUNTER — Ambulatory Visit
Admission: RE | Admit: 2018-05-27 | Discharge: 2018-05-27 | Disposition: A | Discharge status: Home / Self Care | Payer: Managed Care, Other (non HMO) | Source: Ambulatory Visit | Attending: Orthopaedic Surgery | Admitting: Orthopaedic Surgery

## 2018-05-27 DIAGNOSIS — M1612 Unilateral primary osteoarthritis, left hip: Secondary | ICD-10-CM

## 2018-05-27 MED ORDER — IOPAMIDOL (ISOVUE-M 200) INJECTION 41%
15.0000 mL | Freq: Once | INTRAMUSCULAR | Status: AC
  Administered 2018-05-27: 15 mL via INTRA_ARTICULAR

## 2018-05-28 ENCOUNTER — Encounter: Payer: Self-pay | Admitting: Obstetrics & Gynecology

## 2018-05-28 ENCOUNTER — Ambulatory Visit (INDEPENDENT_AMBULATORY_CARE_PROVIDER_SITE_OTHER): Payer: Managed Care, Other (non HMO) | Admitting: Obstetrics & Gynecology

## 2018-05-28 VITALS — BP 144/88

## 2018-05-28 DIAGNOSIS — R300 Dysuria: Secondary | ICD-10-CM | POA: Diagnosis not present

## 2018-05-28 DIAGNOSIS — N760 Acute vaginitis: Secondary | ICD-10-CM | POA: Diagnosis not present

## 2018-05-28 DIAGNOSIS — B9689 Other specified bacterial agents as the cause of diseases classified elsewhere: Secondary | ICD-10-CM

## 2018-05-28 DIAGNOSIS — N9089 Other specified noninflammatory disorders of vulva and perineum: Secondary | ICD-10-CM | POA: Diagnosis not present

## 2018-05-28 MED ORDER — TINIDAZOLE 500 MG PO TABS: 500.0000 mg | ORAL_TABLET | Freq: Every day | ORAL | 0 refills | Status: AC

## 2018-05-28 NOTE — Progress Notes (Signed)
    Sonya Novak Dec 18, 1970 [id number]        47 y.o.  G1P0010 New partner  RP: knot on right vulva x 2 days  HPI: Noticed a small bump on right inner vulva x 2 days.  Boyfriend x 2 yrs, but newly sexually active with him x last week-end.  No condom use.  Declines STD work-up.   OB History  Gravida Para Term Preterm AB Living  1 0     1 0  SAB TAB Ectopic Multiple Live Births               # Outcome Date GA Lbr Len/2nd Weight Sex Delivery Anes PTL Lv  1 AB             Past medical history,surgical history, problem list, medications, allergies, family history and social history were all reviewed and documented in the EPIC chart.   Directed ROS with pertinent positives and negatives documented in the history of present illness/assessment and plan.  Exam:  Vitals:   05/28/18 1059  BP: (!) 144/88   General appearance:  Normal  Abdomen: Normal  Gynecologic exam: Vulva:  Small ulcer at right inner labia minora.  HSV sureswab done.  U/A: Yellow clear, nitrites negative, white blood cells 0-5, red blood cells negative, bacteria moderate, clue cells present.   Assessment/Plan:  47 y.o. G1P0010   1. Vulvar lesion Small right mid inner labia minora ulcer.  Rule out genital herpes.  Pending SureSwab HSV.  Patient prefers waiting on final results before deciding on treatment.  Declines STD work-up otherwise.  Will strongly recommend full STD work-up if HSV positive.  Strict condom use recommended. - SureSwab HSV, Type 1/2 DNA, PCR  2. Dysuria Urine analysis showing clue cells.  Urine culture pending. - Urinalysis,Complete w/RFL Culture  3. Bacterial vaginosis Bacterial vaginosis with clue cells in urine.  Will treat with tinidazole 4 tablets daily for 2 days.  Usage reviewed with patient and prescription sent to pharmacy.  Other orders - tinidazole (TINDAMAX) 500 MG tablet; Take 1 tablet (500 mg total) by mouth daily for 2 days.  Counseling on above issues and  coordination of care more than 50% for 25 minutes.  Princess Bruins MD, 11:06 AM 05/28/2018

## 2018-05-29 ENCOUNTER — Encounter: Payer: Self-pay | Admitting: Obstetrics & Gynecology

## 2018-05-29 NOTE — Patient Instructions (Signed)
1. Vulvar lesion Small right mid inner labia minora ulcer.  Rule out genital herpes.  Pending SureSwab HSV.  Patient prefers waiting on final results before deciding on treatment.  Declines STD work-up otherwise.  Will strongly recommend full STD work-up if HSV positive.  Strict condom use recommended. - SureSwab HSV, Type 1/2 DNA, PCR  2. Dysuria Urine analysis showing clue cells.  Urine culture pending. - Urinalysis,Complete w/RFL Culture  3. Bacterial vaginosis Bacterial vaginosis with clue cells in urine.  Will treat with tinidazole 4 tablets daily for 2 days.  Usage reviewed with patient and prescription sent to pharmacy.  Other orders - tinidazole (TINDAMAX) 500 MG tablet; Take 1 tablet (500 mg total) by mouth daily for 2 days.  Anderson Malta, good seeing you today!  I will inform you of your results as soon as they are available.

## 2018-05-30 LAB — URINALYSIS, COMPLETE W/RFL CULTURE
Bilirubin Urine: NEGATIVE
Glucose, UA: NEGATIVE
Hgb urine dipstick: NEGATIVE
Hyaline Cast: NONE SEEN /LPF
Ketones, ur: NEGATIVE
Nitrites, Initial: NEGATIVE
Protein, ur: NEGATIVE
RBC / HPF: NONE SEEN /HPF (ref 0–2)
Specific Gravity, Urine: 1.015 (ref 1.001–1.03)
pH: 7 (ref 5.0–8.0)

## 2018-05-30 LAB — URINE CULTURE
MICRO NUMBER:: 90891282
SPECIMEN QUALITY:: ADEQUATE

## 2018-05-30 LAB — CULTURE INDICATED

## 2018-05-31 ENCOUNTER — Other Ambulatory Visit: Payer: Self-pay

## 2018-05-31 MED ORDER — AMOXICILLIN 500 MG PO CAPS: 500.0000 mg | ORAL_CAPSULE | Freq: Three times a day (TID) | ORAL | 0 refills | Status: DC

## 2018-06-01 ENCOUNTER — Ambulatory Visit (INDEPENDENT_AMBULATORY_CARE_PROVIDER_SITE_OTHER): Payer: Managed Care, Other (non HMO) | Admitting: Orthopaedic Surgery

## 2018-06-01 DIAGNOSIS — M25552 Pain in left hip: Secondary | ICD-10-CM

## 2018-06-01 NOTE — Progress Notes (Signed)
Office Visit Note   Patient: Sonya Novak           Date of Birth: [birth year]           MRN: [id number] Visit Date: 06/01/2018              Requested by: Mayra Neer, MD [address] [place],  [address] PCP: Mayra Neer, MD   Assessment & Plan: Visit Diagnoses:  1. Pain in left hip     Plan: MRI findings are consistent with degenerative tear of the anterior superior labrum and mild thinning of the anterior superior acetabular cartilage.  There are benign ovarian cyst found incidentally.  These findings were discussed with the patient my recommendation is for continued conservative treatment.  Patient declined an injection she had about 3 weeks of relief injection.  We reviewed low impact activities to help preserve her hip joint for as long as she can.  NSAIDs as needed.  Questions encouraged and answered.  Follow-up as needed.  Follow-Up Instructions: Return if symptoms worsen or fail to improve.   Orders:  No orders of the defined types were placed in this encounter.  No orders of the defined types were placed in this encounter.     Procedures: No procedures performed   Clinical Data: No additional findings.   Subjective: Chief Complaint  Patient presents with  . Left Hip - Follow-up    MRI review    Patient comes in for my review.  Her left hip pain is stable   Review of Systems   Objective: Vital Signs: LMP 06/01/2016   Physical Exam  Ortho Exam Left hip exam is stable. Specialty Comments:  No specialty comments available.  Imaging: No results found.   PMFS History: Patient Active Problem List   Diagnosis Date Noted  . Postoperative state 06/19/2016  . MDD (major depressive disorder), recurrent severe, without psychosis ([place]) 09/14/2013  . Suicidal ideation 09/14/2013  . ADHD (attention deficit hyperactivity disorder) 09/14/2013   Past Medical History:  Diagnosis Date  . Anxiety   . Asthma    related to  allergies- occ inhaler use  . Depression   . Family history of adverse reaction to anesthesia    mother PONV  . Recurrent upper respiratory infection (URI)    allergy related    Family History  Problem Relation Age of Onset  . Hypertension Mother   . Diabetes Brother   . Diabetes Maternal Uncle   . Diabetes Maternal Grandfather     Past Surgical History:  Procedure Laterality Date  . BILATERAL SALPINGECTOMY Bilateral 06/19/2016   Procedure: BILATERAL SALPINGECTOMY;  Surgeon: Princess Bruins, MD;  Location: [facility];  Service: Gynecology;  Laterality: Bilateral;  . ROBOT ASSISTED MYOMECTOMY  05/21/2011   Procedure: ROBOTIC ASSISTED MYOMECTOMY;  Surgeon: Princess Bruins;  Location: [facility];  Service: Gynecology;  Laterality: N/A;  . ROBOTIC ASSISTED SUPRACERVICAL HYSTERECTOMY N/A 06/19/2016   Procedure: ROBOTIC ASSISTED SUPRACERVICAL HYSTERECTOMY;  Surgeon: Princess Bruins, MD;  Location: [facility];  Service: Gynecology;  Laterality: N/A;  . TONSILLECTOMY     Social History   Occupational History  . Not on file  Tobacco Use  . Smoking status: Former Smoker    Types: Cigarettes  . Smokeless tobacco: Former Network engineer and Sexual Activity  . Alcohol use: Yes    Alcohol/week: 0.6 oz    Types: 1 Glasses of wine per week    Comment: 2 drinks a week   .  Drug use: No  . Sexual activity: Yes    Partners: Male    Birth control/protection: None    Comment: 1st intercourse- 41, partners- ?,

## 2018-06-02 LAB — SURESWAB HSV, TYPE 1/2 DNA, PCR
HSV 1 DNA: NOT DETECTED
HSV 2 DNA: NOT DETECTED

## 2018-08-22 ENCOUNTER — Other Ambulatory Visit: Payer: Self-pay | Admitting: Psychiatry

## 2019-02-15 ENCOUNTER — Encounter: Payer: Managed Care, Other (non HMO) | Admitting: Obstetrics & Gynecology

## 2019-02-25 ENCOUNTER — Other Ambulatory Visit: Payer: Self-pay

## 2019-02-25 ENCOUNTER — Ambulatory Visit: Payer: Managed Care, Other (non HMO) | Admitting: Obstetrics & Gynecology

## 2019-02-25 ENCOUNTER — Encounter: Payer: Self-pay | Admitting: Obstetrics & Gynecology

## 2019-02-25 VITALS — BP 136/82

## 2019-02-25 DIAGNOSIS — Z1272 Encounter for screening for malignant neoplasm of vagina: Secondary | ICD-10-CM | POA: Diagnosis not present

## 2019-02-25 DIAGNOSIS — N924 Excessive bleeding in the premenopausal period: Secondary | ICD-10-CM

## 2019-02-25 DIAGNOSIS — N898 Other specified noninflammatory disorders of vagina: Secondary | ICD-10-CM | POA: Diagnosis not present

## 2019-02-25 DIAGNOSIS — R300 Dysuria: Secondary | ICD-10-CM

## 2019-02-25 LAB — WET PREP FOR TRICH, YEAST, CLUE

## 2019-02-25 MED ORDER — TINIDAZOLE 500 MG PO TABS: 500.0000 mg | ORAL_TABLET | Freq: Every day | ORAL | 0 refills | Status: AC

## 2019-02-25 MED ORDER — FLUCONAZOLE 150 MG PO TABS: 150.0000 mg | ORAL_TABLET | Freq: Once | ORAL | 1 refills | Status: AC

## 2019-02-25 NOTE — Progress Notes (Signed)
    Sonya Novak 1971-05-23 [id number]        48 y.o.  G1P0010   RP: Vaginal discharge with odor x a few days  HPI: Vaginal discharge with odor and some irritation x a few days.  Mild vaginal bleeding after IC.  S/P Robotic Supracervical hysterectomy/Bilateral Salpingectomy/Rt abdomino-pelvic wall Myomectomies in 06/2016.  Has had irregular vaginal bleeding on-off since then and recently especially after IC as mentioned above.  No abdo-pelvic pain.  No fever.  Urine wnl/BMs normal.   OB History  Gravida Para Term Preterm AB Living  1 0     1 0  SAB TAB Ectopic Multiple Live Births               # Outcome Date GA Lbr Len/2nd Weight Sex Delivery Anes PTL Lv  1 AB             Past medical history,surgical history, problem list, medications, allergies, family history and social history were all reviewed and documented in the EPIC chart.   Directed ROS with pertinent positives and negatives documented in the history of present illness/assessment and plan.  Exam:  Vitals:   02/25/19 1120  BP: 136/82   General appearance:  Normal  Abdomen: Normal  Gynecologic exam: Vulva normal.  Speculum:  Cervix normal, but mild red blood with mucous at exo-cervix. Vagina normal.  Increased vaginal discharge.  Wet prep done.  Bimanual exam:  Cervix NT, no pelvic mass felt.  Wet prep:  Clue cells present  U/A: Dark yellow, clear, Nitrite Negative, WBC 6-10, RBC 20-40, Moderate bacteria, Moderate Mucus.  Pending Urine culture.   Assessment/Plan:  48 y.o. G1P0010   1. Vaginal discharge Bacterial Vaginosis confirmed by wet prep.  Will treat with Tinidazole 8 tab/day x 2 days.  Usage reviewed and prescription sent to pharmacy.  Will take a Fluconazole 150 mg/tab x 1 tab to treat or prevent yeast vaginitis.  Usage reviewed and prescription sent to pharmacy. - WET PREP FOR TRICH, YEAST, CLUE  2. Vaginal odor As above, BV confirmed by wet prep. - WET PREP FOR [place], YEAST, CLUE  3. Abnormal  perimenopausal bleeding S/P Robotic Supracervical Hysterectomy/Bilateral Salpingectomy/Rt abdo-pelvic wall Myomectomies.  Pap test done.  Will f/u for a Pelvic US to r/o cervical pathology/lower uterine segment remaining/Recurrence of Fibroids?  [place] to evaluate menopausal status. - Pap reflex done - [facility] - US Transvaginal Non-OB; Future  5. Dysuria U/A abnormal, but probable contamination at vulva.  Will wait on U. Culture to decide if treatment is indicated. - Urinalysis,Complete w/RFL Culture  Other orders - tinidazole (TINDAMAX) 500 MG tablet; Take 1 tablet (500 mg total) by mouth daily for 2 days. - fluconazole (DIFLUCAN) 150 MG tablet; Take 1 tablet (150 mg total) by mouth once for 1 dose.  Counseling on above issues and coordination of care more than 50% for 25 minutes.  Princess Bruins MD, 11:26 AM 02/25/2019

## 2019-02-25 NOTE — Patient Instructions (Signed)
1. Vaginal discharge Bacterial Vaginosis confirmed by wet prep.  Will treat with Tinidazole 8 tab/day x 2 days.  Usage reviewed and prescription sent to pharmacy.  Will take a Fluconazole 150 mg/tab x 1 tab to treat or prevent yeast vaginitis.  Usage reviewed and prescription sent to pharmacy. - WET PREP FOR TRICH, YEAST, CLUE  2. Vaginal odor As above, BV confirmed by wet prep. - WET PREP FOR [place], YEAST, CLUE  3. Abnormal perimenopausal bleeding S/P Robotic Supracervical Hysterectomy/Bilateral Salpingectomy/Rt abdo-pelvic wall Myomectomies.  Pap test done.  Will f/u for a Pelvic US to r/o cervical pathology/lower uterine segment remaining/Recurrence of Fibroids?  [place] to evaluate menopausal status. - Pap reflex done - [facility] - US Transvaginal Non-OB; Future  5. Dysuria U/A abnormal, but probable contamination at vulva.  Will wait on U. Culture to decide if treatment is indicated. - Urinalysis,Complete w/RFL Culture  Other orders - tinidazole (TINDAMAX) 500 MG tablet; Take 1 tablet (500 mg total) by mouth daily for 2 days. - fluconazole (DIFLUCAN) 150 MG tablet; Take 1 tablet (150 mg total) by mouth once for 1 dose.  Sonya Novak, it was a pleasure seeing you today!  I will inform you of your results as soon as they are available.

## 2019-02-26 LAB — FOLLICLE STIMULATING HORMONE: FSH: 9.7 m[IU]/mL

## 2019-02-27 LAB — URINALYSIS, COMPLETE W/RFL CULTURE
Bilirubin Urine: NEGATIVE
Glucose, UA: NEGATIVE
Hyaline Cast: NONE SEEN /LPF
Ketones, ur: NEGATIVE
Nitrites, Initial: NEGATIVE
Protein, ur: NEGATIVE
Specific Gravity, Urine: 1.026 (ref 1.001–1.03)
pH: 5.5 (ref 5.0–8.0)

## 2019-02-27 LAB — URINE CULTURE
MICRO NUMBER:: 422739
SPECIMEN QUALITY:: ADEQUATE

## 2019-02-27 LAB — CULTURE INDICATED

## 2019-03-01 ENCOUNTER — Other Ambulatory Visit: Payer: Self-pay

## 2019-03-01 LAB — PAP IG W/ RFLX HPV ASCU

## 2019-03-01 LAB — HUMAN PAPILLOMAVIRUS, HIGH RISK

## 2019-03-03 ENCOUNTER — Other Ambulatory Visit: Payer: Self-pay | Admitting: Obstetrics & Gynecology

## 2019-03-03 ENCOUNTER — Other Ambulatory Visit: Payer: Self-pay

## 2019-03-03 ENCOUNTER — Encounter: Payer: Self-pay | Admitting: Obstetrics & Gynecology

## 2019-03-03 ENCOUNTER — Ambulatory Visit (INDEPENDENT_AMBULATORY_CARE_PROVIDER_SITE_OTHER): Payer: Managed Care, Other (non HMO)

## 2019-03-03 ENCOUNTER — Ambulatory Visit: Payer: Managed Care, Other (non HMO) | Admitting: Obstetrics & Gynecology

## 2019-03-03 VITALS — BP 136/84

## 2019-03-03 DIAGNOSIS — N924 Excessive bleeding in the premenopausal period: Secondary | ICD-10-CM

## 2019-03-03 DIAGNOSIS — R1900 Intra-abdominal and pelvic swelling, mass and lump, unspecified site: Secondary | ICD-10-CM

## 2019-03-03 DIAGNOSIS — D219 Benign neoplasm of connective and other soft tissue, unspecified: Secondary | ICD-10-CM

## 2019-03-03 DIAGNOSIS — N930 Postcoital and contact bleeding: Secondary | ICD-10-CM

## 2019-03-03 DIAGNOSIS — Z90711 Acquired absence of uterus with remaining cervical stump: Secondary | ICD-10-CM

## 2019-03-03 DIAGNOSIS — R87610 Atypical squamous cells of undetermined significance on cytologic smear of cervix (ASC-US): Secondary | ICD-10-CM

## 2019-03-03 NOTE — Progress Notes (Signed)
    Sonya Novak [birth year] [id number]        48 y.o.  G1P0010 boyfriend  RP: Postcoital spotting for pelvic ultrasound  HPI: Status post robotic supracervical hysterectomy with bilateral salpingectomy and myomectomies of 2 satellite fibroids at the right abdominal pelvic wall in August 2017.  No abdominopelvic pain.  Pelvic ultrasound today for postcoital spotting, patient reports that her partner has a large penis.  Treated for bacterial vaginosis at last visit February 25, 2019.  Declined STI screening.   OB History  Gravida Para Term Preterm AB Living  1 0     1 0  SAB TAB Ectopic Multiple Live Births               # Outcome Date GA Lbr Len/2nd Weight Sex Delivery Anes PTL Lv  1 AB             Past medical history,surgical history, problem list, medications, allergies, family history and social history were all reviewed and documented in the EPIC chart.   Directed ROS with pertinent positives and negatives documented in the history of present illness/assessment and plan.  Exam:  Vitals:   03/03/19 1313  BP: 136/84   General appearance:  Normal  Pelvic US today: T/V and T/A images.  Uterus is surgically absent.  Cervix remains measuring 3.3 x 2.6 cm with no cervical mass seen, cervical canal normal.  Both ovaries normal in size with follicular pattern.  Right ovary only visible with transabdominal images.  2 solid masses separate from the ovaries and cervix: Right side measuring 2.1 x 1.6 cm and left side measuring 2.7 x 2.5 cm.  Probable satellite fibroids. (History of 2 large satellite fibroids removed from the right abdominal pelvic wall in 2017).  No free fluid in the posterior cul-de-sac.  Gynecologic exam: Vulva normal.  Vagina normal.  Cervix normal.  HPV high-risk done.   Assessment/Plan:  48 y.o. G1P0010   1. Postcoital bleeding Status post robotic supracervical hysterectomy.  Gynecologic exam.  Pelvic ultrasound today reveals a normal cervix including a normal  cervical canal.  Pelvic ultrasound findings reviewed thoroughly.  Patient reassured.  Postcoital spotting may be associated with irritation with intercourse.  Recommend coconut oil.  Will observe and patient will follow-up for repeat evaluation if worsens.  2. S/P laparoscopic supracervical hysterectomy  3. Fibroids 2 small asymptomatic probable satellite fibroids seen on ultrasound.  Patient informed.  Will reassess by ultrasound if develops abdominal pelvic pain.  4. Pap smear abnormality of cervix with ASCUS favoring benign ASCUS on recent Pap test February 25, 2019.  For technical reason HPV high-risk could not be done.  HPV high-risk done today.  Management per results.  Counseling on above issues and coordination of care more than 50% for 15 minutes.  Princess Bruins MD, 1:28 PM 03/03/2019

## 2019-03-03 NOTE — Patient Instructions (Signed)
1. Postcoital bleeding Status post robotic supracervical hysterectomy.  Gynecologic exam.  Pelvic ultrasound today reveals a normal cervix including a normal cervical canal.  Pelvic ultrasound findings reviewed thoroughly.  Patient reassured.  Postcoital spotting may be associated with irritation with intercourse.  Recommend coconut oil.  Will observe and patient will follow-up for repeat evaluation if worsens.  2. S/P laparoscopic supracervical hysterectomy  3. Fibroids 2 small asymptomatic probable satellite fibroids seen on ultrasound.  Patient informed.  Will reassess by ultrasound if develops abdominal pelvic pain.  4. Pap smear abnormality of cervix with ASCUS favoring benign ASCUS on recent Pap test February 25, 2019.  For technical reason HPV high-risk could not be done.  HPV high-risk done today.  Management per results.  Sonya Novak, it was a pleasure seeing you today!  I will inform you of your results as soon as they are available.

## 2019-03-08 ENCOUNTER — Other Ambulatory Visit: Payer: Self-pay

## 2019-03-09 ENCOUNTER — Encounter: Payer: Self-pay | Admitting: Obstetrics & Gynecology

## 2019-03-09 ENCOUNTER — Ambulatory Visit (INDEPENDENT_AMBULATORY_CARE_PROVIDER_SITE_OTHER): Payer: Managed Care, Other (non HMO) | Admitting: Obstetrics & Gynecology

## 2019-03-09 VITALS — BP 130/80 | Ht 63.0 in | Wt 171.0 lb

## 2019-03-09 DIAGNOSIS — Z6830 Body mass index (BMI) 30.0-30.9, adult: Secondary | ICD-10-CM

## 2019-03-09 DIAGNOSIS — Z90711 Acquired absence of uterus with remaining cervical stump: Secondary | ICD-10-CM

## 2019-03-09 DIAGNOSIS — Z01419 Encounter for gynecological examination (general) (routine) without abnormal findings: Secondary | ICD-10-CM | POA: Diagnosis not present

## 2019-03-09 DIAGNOSIS — E6609 Other obesity due to excess calories: Secondary | ICD-10-CM | POA: Diagnosis not present

## 2019-03-09 LAB — HPV TYPE 16 AND 18/45 RNA
HPV Type 16 RNA: NOT DETECTED
HPV Type 18/45 RNA: NOT DETECTED

## 2019-03-09 NOTE — Progress Notes (Signed)
Sonya Novak [birth year] [id number]   History:    48 y.o. G1P0A1 Boyfriend  RP:  Established patient presenting for annual gyn exam   HPI: S/P Robotic Supracervical hysterectomy/Bilateral Salpingectomy/Rt abdomino-pelvic wall Myomectomies in 06/2016.  Pelvic US 03/03/2019 for postcoital spotting: Cervix/endocervical canal normal.  No pelvic pain.  No pain with IC, no longer having postcoital spotting.  Urine/BMs normal.  Breasts normal.  BMI 30.29.  Walking regularly with friends and dog.  Working on nutrition, cutting sodas.  Health labs with Dr Brigitte Pulse.  Past medical history,surgical history, family history and social history were all reviewed and documented in the EPIC chart.  Gynecologic History Patient's last menstrual period was 06/01/2016. Contraception: post menopausal status Last Pap: 02/2019. Results were: ASCUS/HPV 16-18-45 negative Last mammogram: 12/2018. Results were: Benign Bone Density: Never Colonoscopy: Never  Obstetric History OB History  Gravida Para Term Preterm AB Living  1 0     1 0  SAB TAB Ectopic Multiple Live Births               # Outcome Date GA Lbr Len/2nd Weight Sex Delivery Anes PTL Lv  1 AB              ROS: A ROS was performed and pertinent positives and negatives are included in the history.  GENERAL: No fevers or chills. HEENT: No change in vision, no earache, sore throat or sinus congestion. NECK: No pain or stiffness. CARDIOVASCULAR: No chest pain or pressure. No palpitations. PULMONARY: No shortness of breath, cough or wheeze. GASTROINTESTINAL: No abdominal pain, nausea, vomiting or diarrhea, melena or bright red blood per rectum. GENITOURINARY: No urinary frequency, urgency, hesitancy or dysuria. MUSCULOSKELETAL: No joint or muscle pain, no back pain, no recent trauma. DERMATOLOGIC: No rash, no itching, no lesions. ENDOCRINE: No polyuria, polydipsia, no heat or cold intolerance. No recent change in weight. HEMATOLOGICAL: No anemia or easy  bruising or bleeding. NEUROLOGIC: No headache, seizures, numbness, tingling or weakness. PSYCHIATRIC: No depression, no loss of interest in normal activity or change in sleep pattern.     Exam:   BP 130/80   Ht 5\' 3"  (1.6 m)   Wt 171 lb (77.6 kg)   LMP 06/01/2016   BMI 30.29 kg/m   Body mass index is 30.29 kg/m.  General appearance : Well developed well nourished female. No acute distress HEENT: Eyes: no retinal hemorrhage or exudates,  Neck supple, trachea midline, no carotid bruits, no thyroidmegaly Lungs: Clear to auscultation, no rhonchi or wheezes, or rib retractions  Heart: Regular rate and rhythm, no murmurs or gallops Breast:Examined in sitting and supine position were symmetrical in appearance, no palpable masses or tenderness,  no skin retraction, no nipple inversion, no nipple discharge, no skin discoloration, no axillary or supraclavicular lymphadenopathy Abdomen: no palpable masses or tenderness, no rebound or guarding Extremities: no edema or skin discoloration or tenderness  Pelvic: Vulva: Normal             Vagina: No gross lesions or discharge  Cervix: No gross lesions or discharge  Uterus absent (supracervical hysterectomy)  Adnexa  Without masses or tenderness  Anus: Normal  Pelvic US 03/03/2019:  T/V and T/A images.  Uterus is surgically absent.  Cervix remains measuring 3.3 x 2.6 cm with no cervical mass seen, cervical canal normal.  Both ovaries normal in size with follicular pattern.  Right ovary only visible with transabdominal images.  2 solid masses separate from the ovaries and cervix: Right side measuring  2.1 x 1.6 cm and left side measuring 2.7 x 2.5 cm.  Probable satellite fibroids. (History of 2 large satellite fibroids removed from the right abdominal pelvic wall in 2017).  No free fluid in the posterior cul-de-sac.   Assessment/Plan:  48 y.o. female for annual exam   1. Well female exam with routine gynecological exam Gynecologic exam status post  laparoscopic supracervical hysterectomy.  Pap test with ASCUS but HPV 16-18-40 5- in April 2020.  We will repeat a Pap test next year.  Breast exam normal.  Screening mammogram February 2020 was benign.  2. S/P laparoscopic supracervical hysterectomy Resolved postcoital bleeding.  Recent investigation was negative including a pelvic ultrasound showing a normal endocervical canal.  3. Class 1 obesity due to excess calories without serious comorbidity with body mass index (BMI) of 30.0 to 30.9 in adult Body mass index 30.29.  Recommend a lower calorie/carb diet such as [employer] and increased physical activities with aerobic activities 5 times a week and weightlifting every 2 days.  Princess Bruins MD, 10:24 AM 03/09/2019

## 2019-03-11 ENCOUNTER — Encounter: Payer: Self-pay | Admitting: Obstetrics & Gynecology

## 2019-03-11 NOTE — Patient Instructions (Signed)
1. Well female exam with routine gynecological exam Gynecologic exam status post laparoscopic supracervical hysterectomy.  Pap test with ASCUS but HPV 16-18-40 5- in April 2020.  We will repeat a Pap test next year.  Breast exam normal.  Screening mammogram February 2020 was benign.  2. S/P laparoscopic supracervical hysterectomy Resolved postcoital bleeding.  Recent investigation was negative including a pelvic ultrasound showing a normal endocervical canal.  3. Class 1 obesity due to excess calories without serious comorbidity with body mass index (BMI) of 30.0 to 30.9 in adult Body mass index 30.29.  Recommend a lower calorie/carb diet such as [employer] and increased physical activities with aerobic activities 5 times a week and weightlifting every 2 days.  Sonya Novak, it was a pleasure seeing you today!

## 2019-03-25 ENCOUNTER — Other Ambulatory Visit: Payer: Self-pay | Admitting: Obstetrics & Gynecology

## 2019-03-30 ENCOUNTER — Other Ambulatory Visit: Payer: Self-pay

## 2019-03-30 ENCOUNTER — Encounter: Payer: Self-pay | Admitting: Women's Health

## 2019-03-30 ENCOUNTER — Ambulatory Visit: Payer: Managed Care, Other (non HMO) | Admitting: Women's Health

## 2019-03-30 VITALS — BP 124/78

## 2019-03-30 DIAGNOSIS — N898 Other specified noninflammatory disorders of vagina: Secondary | ICD-10-CM

## 2019-03-30 DIAGNOSIS — R350 Frequency of micturition: Secondary | ICD-10-CM

## 2019-03-30 DIAGNOSIS — Z113 Encounter for screening for infections with a predominantly sexual mode of transmission: Secondary | ICD-10-CM

## 2019-03-30 LAB — WET PREP FOR TRICH, YEAST, CLUE

## 2019-03-30 MED ORDER — METRONIDAZOLE 500 MG PO TABS: 500.0000 mg | ORAL_TABLET | Freq: Two times a day (BID) | ORAL | 0 refills | Status: DC

## 2019-03-30 MED ORDER — TINIDAZOLE 500 MG PO TABS: ORAL_TABLET | ORAL | 0 refills | Status: DC

## 2019-03-30 NOTE — Patient Instructions (Signed)
Bacterial Vaginosis    Bacterial vaginosis is a vaginal infection that occurs when the normal balance of bacteria in the vagina is disrupted. It results from an overgrowth of certain bacteria. This is the most common vaginal infection among women ages 15-44.  Because bacterial vaginosis increases your risk for STIs (sexually transmitted infections), getting treated can help reduce your risk for chlamydia, gonorrhea, herpes, and HIV (human immunodeficiency virus). Treatment is also important for preventing complications in pregnant women, because this condition can cause an early (premature) delivery.  What are the causes?  This condition is caused by an increase in harmful bacteria that are normally present in small amounts in the vagina. However, the reason that the condition develops is not fully understood.  What increases the risk?  The following factors may make you more likely to develop this condition:  · Having a new sexual partner or multiple sexual partners.  · Having unprotected sex.  · Douching.  · Having an intrauterine device (IUD).  · Smoking.  · Drug and alcohol abuse.  · Taking certain antibiotic medicines.  · Being pregnant.  You cannot get bacterial vaginosis from toilet seats, bedding, swimming pools, or contact with objects around you.  What are the signs or symptoms?  Symptoms of this condition include:  · Grey or white vaginal discharge. The discharge can also be watery or foamy.  · A fish-like odor with discharge, especially after sexual intercourse or during menstruation.  · Itching in and around the vagina.  · Burning or pain with urination.  Some women with bacterial vaginosis have no signs or symptoms.  How is this diagnosed?  This condition is diagnosed based on:  · Your medical history.  · A physical exam of the vagina.  · Testing a sample of vaginal fluid under a microscope to look for a large amount of bad bacteria or abnormal cells. Your health care provider may use a cotton swab or  a small wooden spatula to collect the sample.  How is this treated?  This condition is treated with antibiotics. These may be given as a pill, a vaginal cream, or a medicine that is put into the vagina (suppository). If the condition comes back after treatment, a second round of antibiotics may be needed.  Follow these instructions at home:  Medicines  · Take over-the-counter and prescription medicines only as told by your health care provider.  · Take or use your antibiotic as told by your health care provider. Do not stop taking or using the antibiotic even if you start to feel better.  General instructions  · If you have a female sexual partner, tell her that you have a vaginal infection. She should see her health care provider and be treated if she has symptoms. If you have a female sexual partner, he does not need treatment.  · During treatment:  ? Avoid sexual activity until you finish treatment.  ? Do not douche.  ? Avoid alcohol as directed by your health care provider.  ? Avoid breastfeeding as directed by your health care provider.  · Drink enough water and fluids to keep your urine clear or pale yellow.  · Keep the area around your vagina and rectum clean.  ? Wash the area daily with warm water.  ? Wipe yourself from front to back after using the toilet.  · Keep all follow-up visits as told by your health care provider. This is important.  How is this prevented?  · Do not   douche.  · Wash the outside of your vagina with warm water only.  · Use protection when having sex. This includes latex condoms and dental dams.  · Limit how many sexual partners you have. To help prevent bacterial vaginosis, it is best to have sex with just one partner (monogamous).  · Make sure you and your sexual partner are tested for STIs.  · Wear cotton or cotton-lined underwear.  · Avoid wearing tight pants and pantyhose, especially during summer.  · Limit the amount of alcohol that you drink.  · Do not use any products that contain  nicotine or tobacco, such as cigarettes and e-cigarettes. If you need help quitting, ask your health care provider.  · Do not use illegal drugs.  Where to find more information  · Centers for Disease Control and Prevention: www.cdc.gov/std  · American Sexual Health Association (ASHA): www.ashastd.org  · U.S. Department of Health and Human Services, Office on Women's Health: www.womenshealth.gov/ or https://www.womenshealth.gov/a-z-topics/bacterial-vaginosis  Contact a health care provider if:  · Your symptoms do not improve, even after treatment.  · You have more discharge or pain when urinating.  · You have a fever.  · You have pain in your abdomen.  · You have pain during sex.  · You have vaginal bleeding between periods.  Summary  · Bacterial vaginosis is a vaginal infection that occurs when the normal balance of bacteria in the vagina is disrupted.  · Because bacterial vaginosis increases your risk for STIs (sexually transmitted infections), getting treated can help reduce your risk for chlamydia, gonorrhea, herpes, and HIV (human immunodeficiency virus). Treatment is also important for preventing complications in pregnant women, because the condition can cause an early (premature) delivery.  · This condition is treated with antibiotic medicines. These may be given as a pill, a vaginal cream, or a medicine that is put into the vagina (suppository).  This information is not intended to replace advice given to you by your health care provider. Make sure you discuss any questions you have with your health care provider.  Document Released: 10/20/2005 Document Revised: 02/23/2017 Document Reviewed: 07/05/2016  Elsevier Interactive Patient Education © 2019 Elsevier Inc.

## 2019-03-30 NOTE — Progress Notes (Addendum)
48 year old D WF G0 presents with complaint of greenish discharge with irritation for the past week, questions if it is a bacterial vaginosis.  New partner, recently engaged.  Denies urinary symptoms, abdominal/back pain, vaginal itching or fever.  2017 robotic supracervical TVH for fibroids on no HRT with minimal menopausal symptoms other than being warm always.  02/2019 [place].  Medical problems include anxiety/depression and hypothyroidism primary care manages.  Exam: Appears well.  No CVAT.  No CVAT.  Abdomen soft without rebound or radiation, external genitalia within normal limits, speculum exam moderate amount of a greenish discharge noted with odor, wet prep positive for clues, TNTC bacteria.  GC/chlamydia culture taken from urethra. UA: Negative nitrites, leukocytes, trace blood, 0-5 WBCs, 0-2 RBCs, few bacteria  Bacterial vaginosis STD screen  Plan: Planning to go to the beach in 2 days requested shortest treatment, Tindamax 2 g p.o. daily for 2 days.  Prescription, proper use given and reviewed.  Instructed to call if no relief of symptoms.  Denies need for HIV or RPR.

## 2019-03-31 LAB — C. TRACHOMATIS/N. GONORRHOEAE RNA
C. trachomatis RNA, TMA: NOT DETECTED
N. gonorrhoeae RNA, TMA: NOT DETECTED

## 2019-04-01 LAB — URINE CULTURE
MICRO NUMBER:: 514156
Result:: NO GROWTH
SPECIMEN QUALITY:: ADEQUATE

## 2019-04-01 LAB — URINALYSIS, COMPLETE W/RFL CULTURE
Bilirubin Urine: NEGATIVE
Glucose, UA: NEGATIVE
Hyaline Cast: NONE SEEN /LPF
Ketones, ur: NEGATIVE
Leukocyte Esterase: NEGATIVE
Nitrites, Initial: NEGATIVE
Protein, ur: NEGATIVE
Specific Gravity, Urine: 1.025 (ref 1.001–1.03)
pH: 7 (ref 5.0–8.0)

## 2019-04-01 LAB — CULTURE INDICATED

## 2019-04-21 ENCOUNTER — Encounter: Payer: Managed Care, Other (non HMO) | Admitting: Obstetrics & Gynecology

## 2019-06-07 ENCOUNTER — Ambulatory Visit: Payer: Managed Care, Other (non HMO)

## 2019-06-07 ENCOUNTER — Encounter: Payer: Managed Care, Other (non HMO) | Admitting: Podiatry

## 2019-06-07 NOTE — Progress Notes (Signed)
This encounter was created in error - please disregard.

## 2019-06-22 ENCOUNTER — Ambulatory Visit (INDEPENDENT_AMBULATORY_CARE_PROVIDER_SITE_OTHER): Payer: Managed Care, Other (non HMO)

## 2019-06-22 ENCOUNTER — Encounter: Payer: Self-pay | Admitting: Podiatry

## 2019-06-22 ENCOUNTER — Other Ambulatory Visit: Payer: Self-pay | Admitting: Podiatry

## 2019-06-22 ENCOUNTER — Other Ambulatory Visit: Payer: Self-pay

## 2019-06-22 ENCOUNTER — Ambulatory Visit: Payer: Managed Care, Other (non HMO) | Admitting: Podiatry

## 2019-06-22 VITALS — Temp 97.9°F

## 2019-06-22 DIAGNOSIS — S93601A Unspecified sprain of right foot, initial encounter: Secondary | ICD-10-CM

## 2019-06-22 DIAGNOSIS — M25571 Pain in right ankle and joints of right foot: Secondary | ICD-10-CM

## 2019-06-22 MED ORDER — METHYLPREDNISOLONE 4 MG PO TBPK: ORAL_TABLET | ORAL | 0 refills | Status: DC

## 2019-06-25 NOTE — Progress Notes (Signed)
   HPI: 48 y.o. female presenting today as a new patient with a chief complaint of pain to the medial right ankle that began about one month ago. She states the pain began when she was working out in [employer]. She reports associated redness and swelling of the area. She has been icing and elevating the ankle. Walking increases the pain. Patient is here for further evaluation and treatment.   Past Medical History:  Diagnosis Date  . Anxiety   . Asthma    related to allergies- occ inhaler use  . Depression   . Family history of adverse reaction to anesthesia    mother PONV  . Recurrent upper respiratory infection (URI)    allergy related     Physical Exam: General: The patient is alert and oriented x3 in no acute distress.  Dermatology: Skin is warm, dry and supple bilateral lower extremities. Negative for open lesions or macerations.  Vascular: Palpable pedal pulses bilaterally. No edema or erythema noted. Capillary refill within normal limits.  Neurological: Epicritic and protective threshold grossly intact bilaterally.   Musculoskeletal Exam: Pain with palpation noted to the abductor hallucis muscle belly of the right foot. Range of motion within normal limits to all pedal and ankle joints bilateral. Muscle strength 5/5 in all groups bilateral.   Radiographic Exam:  Normal osseous mineralization. Joint spaces preserved. No fracture/dislocation/boney destruction.    Assessment: 1. Abductor hallucis muscle belly strain right    Plan of Care:  1. Patient evaluated. X-Rays reviewed.  2. Declined injection.  3. CAM boot declined.  4. Prescription for Medrol Dose Pak provided to patient. Then continue taking Meloxicam.  5. Return to clinic in 4 weeks.   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       Edrick Kins, DPM [facility]  Dr. Edrick Kins, DPM    [address], [place] [address]                Office  [phone]  Fax [phone]

## 2019-07-20 ENCOUNTER — Ambulatory Visit: Payer: Managed Care, Other (non HMO) | Admitting: Podiatry

## 2020-03-09 ENCOUNTER — Encounter: Payer: Managed Care, Other (non HMO) | Admitting: Obstetrics & Gynecology

## 2020-07-20 DIAGNOSIS — Z1231 Encounter for screening mammogram for malignant neoplasm of breast: Secondary | ICD-10-CM | POA: Diagnosis not present

## 2020-08-08 DIAGNOSIS — Z23 Encounter for immunization: Secondary | ICD-10-CM | POA: Diagnosis not present

## 2020-08-08 DIAGNOSIS — Z0000 Encounter for general adult medical examination without abnormal findings: Secondary | ICD-10-CM | POA: Diagnosis not present

## 2020-08-08 DIAGNOSIS — Z13220 Encounter for screening for lipoid disorders: Secondary | ICD-10-CM | POA: Diagnosis not present

## 2020-08-08 DIAGNOSIS — Z79899 Other long term (current) drug therapy: Secondary | ICD-10-CM | POA: Diagnosis not present

## 2020-08-08 DIAGNOSIS — E039 Hypothyroidism, unspecified: Secondary | ICD-10-CM | POA: Diagnosis not present

## 2020-08-23 ENCOUNTER — Other Ambulatory Visit (HOSPITAL_COMMUNITY): Payer: Self-pay | Admitting: Gastroenterology

## 2020-08-23 DIAGNOSIS — R110 Nausea: Secondary | ICD-10-CM | POA: Diagnosis not present

## 2020-08-23 DIAGNOSIS — K219 Gastro-esophageal reflux disease without esophagitis: Secondary | ICD-10-CM | POA: Diagnosis not present

## 2020-08-23 DIAGNOSIS — R1013 Epigastric pain: Secondary | ICD-10-CM | POA: Diagnosis not present

## 2020-08-23 DIAGNOSIS — Z1211 Encounter for screening for malignant neoplasm of colon: Secondary | ICD-10-CM | POA: Diagnosis not present

## 2020-08-24 ENCOUNTER — Other Ambulatory Visit: Payer: Self-pay | Admitting: Gastroenterology

## 2020-08-31 ENCOUNTER — Other Ambulatory Visit: Payer: Self-pay

## 2020-08-31 ENCOUNTER — Ambulatory Visit (HOSPITAL_COMMUNITY)
Admission: RE | Admit: 2020-08-31 | Discharge: 2020-08-31 | Disposition: A | Discharge status: Home / Self Care | Payer: BC Managed Care – PPO | Source: Ambulatory Visit | Attending: Gastroenterology | Admitting: Gastroenterology

## 2020-08-31 DIAGNOSIS — R110 Nausea: Secondary | ICD-10-CM | POA: Insufficient documentation

## 2020-08-31 DIAGNOSIS — R1010 Upper abdominal pain, unspecified: Secondary | ICD-10-CM | POA: Diagnosis not present

## 2020-08-31 MED ORDER — TECHNETIUM TC 99M MEBROFENIN IV KIT
5.4000 | PACK | Freq: Once | INTRAVENOUS | Status: AC | PRN
  Administered 2020-08-31: 5.4 via INTRAVENOUS

## 2020-09-05 DIAGNOSIS — F4310 Post-traumatic stress disorder, unspecified: Secondary | ICD-10-CM | POA: Diagnosis not present

## 2020-09-10 DIAGNOSIS — K219 Gastro-esophageal reflux disease without esophagitis: Secondary | ICD-10-CM | POA: Diagnosis not present

## 2020-09-10 DIAGNOSIS — R1013 Epigastric pain: Secondary | ICD-10-CM | POA: Diagnosis not present

## 2020-09-10 DIAGNOSIS — Z1211 Encounter for screening for malignant neoplasm of colon: Secondary | ICD-10-CM | POA: Diagnosis not present

## 2020-09-10 DIAGNOSIS — R110 Nausea: Secondary | ICD-10-CM | POA: Diagnosis not present

## 2020-10-11 DIAGNOSIS — E039 Hypothyroidism, unspecified: Secondary | ICD-10-CM | POA: Diagnosis not present

## 2020-11-14 DIAGNOSIS — F4310 Post-traumatic stress disorder, unspecified: Secondary | ICD-10-CM | POA: Diagnosis not present

## 2020-12-11 ENCOUNTER — Encounter: Payer: Self-pay | Admitting: Obstetrics & Gynecology

## 2020-12-12 DIAGNOSIS — F4310 Post-traumatic stress disorder, unspecified: Secondary | ICD-10-CM | POA: Diagnosis not present

## 2021-01-03 ENCOUNTER — Ambulatory Visit (INDEPENDENT_AMBULATORY_CARE_PROVIDER_SITE_OTHER): Payer: BC Managed Care – PPO | Admitting: Obstetrics & Gynecology

## 2021-01-03 ENCOUNTER — Encounter: Payer: Self-pay | Admitting: Obstetrics & Gynecology

## 2021-01-03 ENCOUNTER — Other Ambulatory Visit: Payer: Self-pay

## 2021-01-03 VITALS — BP 142/96 | Ht 63.0 in | Wt 162.0 lb

## 2021-01-03 DIAGNOSIS — Z90711 Acquired absence of uterus with remaining cervical stump: Secondary | ICD-10-CM | POA: Diagnosis not present

## 2021-01-03 DIAGNOSIS — R350 Frequency of micturition: Secondary | ICD-10-CM

## 2021-01-03 DIAGNOSIS — R87610 Atypical squamous cells of undetermined significance on cytologic smear of cervix (ASC-US): Secondary | ICD-10-CM | POA: Diagnosis not present

## 2021-01-03 DIAGNOSIS — Z01419 Encounter for gynecological examination (general) (routine) without abnormal findings: Secondary | ICD-10-CM | POA: Diagnosis not present

## 2021-01-03 NOTE — Progress Notes (Signed)
MARQUESHA ROBIDEAU May 09, 1971 [id number]   History:    50 y.o. G1P0A1 Boyfriend  RP:  Established patient presenting for annual gyn exam   HPI: S/P Robotic Supracervical hysterectomy/Bilateral Salpingectomy/Rt abdomino-pelvic wall Myomectomies in 06/2016.  Pelvic US 03/03/2019 for postcoital spotting: Cervix/endocervical canal normal.  No pelvic pain.  No pain with IC, no longer having postcoital spotting.  Urine/BMs normal.  Breasts normal.  BMI improved to 28.7.  Walking regularly with friends and dog.  Working on nutrition, cutting sodas. Health labs with Dr Brigitte Pulse.   Past medical history,surgical history, family history and social history were all reviewed and documented in the EPIC chart.  Gynecologic History Patient's last menstrual period was 06/01/2016.  Obstetric History OB History  Gravida Para Term Preterm AB Living  1 0     1 0  SAB IAB Ectopic Multiple Live Births               # Outcome Date GA Lbr Len/2nd Weight Sex Delivery Anes PTL Lv  1 AB              ROS: A ROS was performed and pertinent positives and negatives are included in the history.  GENERAL: No fevers or chills. HEENT: No change in vision, no earache, sore throat or sinus congestion. NECK: No pain or stiffness. CARDIOVASCULAR: No chest pain or pressure. No palpitations. PULMONARY: No shortness of breath, cough or wheeze. GASTROINTESTINAL: No abdominal pain, nausea, vomiting or diarrhea, melena or bright red blood per rectum. GENITOURINARY: No urinary frequency, urgency, hesitancy or dysuria. MUSCULOSKELETAL: No joint or muscle pain, no back pain, no recent trauma. DERMATOLOGIC: No rash, no itching, no lesions. ENDOCRINE: No polyuria, polydipsia, no heat or cold intolerance. No recent change in weight. HEMATOLOGICAL: No anemia or easy bruising or bleeding. NEUROLOGIC: No headache, seizures, numbness, tingling or weakness. PSYCHIATRIC: No depression, no loss of interest in normal activity or change in sleep  pattern.     Exam:   BP (!) 142/96   Ht 5\' 3"  (1.6 m)   Wt 162 lb (73.5 kg)   LMP 06/01/2016   BMI 28.70 kg/m   Body mass index is 28.7 kg/m.  General appearance : Well developed well nourished female. No acute distress HEENT: Eyes: no retinal hemorrhage or exudates,  Neck supple, trachea midline, no carotid bruits, no thyroidmegaly Lungs: Clear to auscultation, no rhonchi or wheezes, or rib retractions  Heart: Regular rate and rhythm, no murmurs or gallops Breast:Examined in sitting and supine position were symmetrical in appearance, no palpable masses or tenderness,  no skin retraction, no nipple inversion, no nipple discharge, no skin discoloration, no axillary or supraclavicular lymphadenopathy Abdomen: no palpable masses or tenderness, no rebound or guarding Extremities: no edema or skin discoloration or tenderness  Pelvic: Vulva: Normal             Vagina: No gross lesions or discharge  Cervix: No gross lesions or discharge.  Pap reflex done.  Uterus AV, normal size, shape and consistency, non-tender and mobile  Adnexa  Without masses or tenderness  Anus: Normal  U/A Negative   Assessment/Plan:  50 y.o. female for annual exam   1. Encounter for routine gynecological examination with Papanicolaou smear of cervix Normal gynecologic exam.  Pap reflex done.  Breast exam normal.  Screening mammo to schedule now.  [place] 09/2020.  Health labs with Fam MD.  2. Pap smear abnormality of cervix with ASCUS favoring benign Pap reflex done today.  3. S/P  laparoscopic supracervical hysterectomy  4. Frequent urination U/A Negative. - Urinalysis,Complete w/RFL Culture  Other orders - levothyroxine (SYNTHROID) 50 MCG tablet; Take 50 mcg by mouth daily before breakfast. - cetirizine (ZYRTEC) 10 MG chewable tablet; Chew 10 mg by mouth daily. - Urine Culture - REFLEXIVE URINE CULTURE  Princess Bruins MD, 4:01 PM 01/03/2021

## 2021-01-04 ENCOUNTER — Other Ambulatory Visit: Payer: Self-pay | Admitting: Obstetrics & Gynecology

## 2021-01-04 ENCOUNTER — Encounter: Payer: Self-pay | Admitting: Obstetrics & Gynecology

## 2021-01-04 DIAGNOSIS — Z1231 Encounter for screening mammogram for malignant neoplasm of breast: Secondary | ICD-10-CM

## 2021-01-05 LAB — URINALYSIS, COMPLETE W/RFL CULTURE
Bacteria, UA: NONE SEEN /HPF
Bilirubin Urine: NEGATIVE
Glucose, UA: NEGATIVE
Hyaline Cast: NONE SEEN /LPF
Ketones, ur: NEGATIVE
Leukocyte Esterase: NEGATIVE
Nitrites, Initial: NEGATIVE
Protein, ur: NEGATIVE
Specific Gravity, Urine: 1.02 (ref 1.001–1.03)
pH: 7.5 (ref 5.0–8.0)

## 2021-01-05 LAB — URINE CULTURE
MICRO NUMBER:: 11603371
SPECIMEN QUALITY:: ADEQUATE

## 2021-01-05 LAB — CULTURE INDICATED

## 2021-01-07 ENCOUNTER — Encounter: Payer: Self-pay | Admitting: Obstetrics & Gynecology

## 2021-01-07 LAB — PAP IG W/ RFLX HPV ASCU

## 2021-01-08 ENCOUNTER — Encounter: Payer: Self-pay | Admitting: Obstetrics & Gynecology

## 2021-01-09 ENCOUNTER — Telehealth: Payer: Self-pay

## 2021-01-09 ENCOUNTER — Encounter: Payer: Self-pay | Admitting: Obstetrics & Gynecology

## 2021-01-09 DIAGNOSIS — F4310 Post-traumatic stress disorder, unspecified: Secondary | ICD-10-CM | POA: Diagnosis not present

## 2021-01-09 NOTE — Telephone Encounter (Signed)
Patient said when she was in for visit Dr. Talbert Nan told her to call [facility] for [facility] as past due. However, she checked and she had mammo at [facility] 07/20/2020.  It is scanned in to her Arts development officer. I told her I would see if we could get it scanned to imaging folder for consistency.  She willl make sure she schedules with [facility] in Sept for her yearly mammo.

## 2021-01-16 DIAGNOSIS — F411 Generalized anxiety disorder: Secondary | ICD-10-CM | POA: Diagnosis not present

## 2021-01-16 DIAGNOSIS — I10 Essential (primary) hypertension: Secondary | ICD-10-CM | POA: Diagnosis not present

## 2021-01-16 DIAGNOSIS — M25519 Pain in unspecified shoulder: Secondary | ICD-10-CM | POA: Diagnosis not present

## 2021-01-16 DIAGNOSIS — R42 Dizziness and giddiness: Secondary | ICD-10-CM | POA: Diagnosis not present

## 2021-02-22 DIAGNOSIS — F4310 Post-traumatic stress disorder, unspecified: Secondary | ICD-10-CM | POA: Diagnosis not present

## 2021-02-27 ENCOUNTER — Ambulatory Visit: Payer: Managed Care, Other (non HMO)

## 2021-03-20 DIAGNOSIS — F4310 Post-traumatic stress disorder, unspecified: Secondary | ICD-10-CM | POA: Diagnosis not present

## 2021-03-25 DIAGNOSIS — H8113 Benign paroxysmal vertigo, bilateral: Secondary | ICD-10-CM | POA: Diagnosis not present

## 2021-03-25 DIAGNOSIS — M25511 Pain in right shoulder: Secondary | ICD-10-CM | POA: Diagnosis not present

## 2021-04-02 DIAGNOSIS — M25511 Pain in right shoulder: Secondary | ICD-10-CM | POA: Diagnosis not present

## 2021-04-02 DIAGNOSIS — H8113 Benign paroxysmal vertigo, bilateral: Secondary | ICD-10-CM | POA: Diagnosis not present

## 2021-04-09 DIAGNOSIS — F4310 Post-traumatic stress disorder, unspecified: Secondary | ICD-10-CM | POA: Diagnosis not present

## 2021-04-23 DIAGNOSIS — F4310 Post-traumatic stress disorder, unspecified: Secondary | ICD-10-CM | POA: Diagnosis not present

## 2021-05-07 DIAGNOSIS — F4310 Post-traumatic stress disorder, unspecified: Secondary | ICD-10-CM | POA: Diagnosis not present

## 2021-05-13 ENCOUNTER — Other Ambulatory Visit: Payer: Self-pay | Admitting: Family Medicine

## 2021-05-13 ENCOUNTER — Ambulatory Visit
Admission: RE | Admit: 2021-05-13 | Discharge: 2021-05-13 | Disposition: A | Discharge status: Home / Self Care | Payer: Managed Care, Other (non HMO) | Source: Ambulatory Visit | Attending: Family Medicine | Admitting: Family Medicine

## 2021-05-13 DIAGNOSIS — R079 Chest pain, unspecified: Secondary | ICD-10-CM

## 2021-05-13 DIAGNOSIS — R0789 Other chest pain: Secondary | ICD-10-CM | POA: Diagnosis not present

## 2021-05-13 DIAGNOSIS — R42 Dizziness and giddiness: Secondary | ICD-10-CM | POA: Diagnosis not present

## 2021-05-28 DIAGNOSIS — F4310 Post-traumatic stress disorder, unspecified: Secondary | ICD-10-CM | POA: Diagnosis not present

## 2021-06-11 DIAGNOSIS — F4310 Post-traumatic stress disorder, unspecified: Secondary | ICD-10-CM | POA: Diagnosis not present

## 2021-06-20 ENCOUNTER — Other Ambulatory Visit: Payer: Self-pay | Admitting: Family Medicine

## 2021-06-20 DIAGNOSIS — Z1231 Encounter for screening mammogram for malignant neoplasm of breast: Secondary | ICD-10-CM

## 2021-06-26 DIAGNOSIS — F4310 Post-traumatic stress disorder, unspecified: Secondary | ICD-10-CM | POA: Diagnosis not present

## 2021-07-22 ENCOUNTER — Ambulatory Visit
Admission: RE | Admit: 2021-07-22 | Discharge: 2021-07-22 | Disposition: A | Discharge status: Home / Self Care | Payer: BC Managed Care – PPO | Source: Ambulatory Visit

## 2021-07-22 ENCOUNTER — Other Ambulatory Visit: Payer: Self-pay

## 2021-07-22 DIAGNOSIS — Z1231 Encounter for screening mammogram for malignant neoplasm of breast: Secondary | ICD-10-CM | POA: Diagnosis not present

## 2021-07-23 DIAGNOSIS — F4310 Post-traumatic stress disorder, unspecified: Secondary | ICD-10-CM | POA: Diagnosis not present

## 2021-08-06 DIAGNOSIS — F4310 Post-traumatic stress disorder, unspecified: Secondary | ICD-10-CM | POA: Diagnosis not present

## 2021-08-14 DIAGNOSIS — Z13220 Encounter for screening for lipoid disorders: Secondary | ICD-10-CM | POA: Diagnosis not present

## 2021-08-14 DIAGNOSIS — I10 Essential (primary) hypertension: Secondary | ICD-10-CM | POA: Diagnosis not present

## 2021-08-14 DIAGNOSIS — E039 Hypothyroidism, unspecified: Secondary | ICD-10-CM | POA: Diagnosis not present

## 2021-08-14 DIAGNOSIS — Z0000 Encounter for general adult medical examination without abnormal findings: Secondary | ICD-10-CM | POA: Diagnosis not present

## 2021-08-20 DIAGNOSIS — F4310 Post-traumatic stress disorder, unspecified: Secondary | ICD-10-CM | POA: Diagnosis not present

## 2021-09-03 DIAGNOSIS — E039 Hypothyroidism, unspecified: Secondary | ICD-10-CM | POA: Diagnosis not present

## 2021-09-03 DIAGNOSIS — G4710 Hypersomnia, unspecified: Secondary | ICD-10-CM | POA: Diagnosis not present

## 2021-09-03 DIAGNOSIS — F4310 Post-traumatic stress disorder, unspecified: Secondary | ICD-10-CM | POA: Diagnosis not present

## 2021-09-03 DIAGNOSIS — I10 Essential (primary) hypertension: Secondary | ICD-10-CM | POA: Diagnosis not present

## 2021-09-03 DIAGNOSIS — G4719 Other hypersomnia: Secondary | ICD-10-CM | POA: Diagnosis not present

## 2021-09-17 DIAGNOSIS — F4310 Post-traumatic stress disorder, unspecified: Secondary | ICD-10-CM | POA: Diagnosis not present

## 2021-10-04 DIAGNOSIS — F4310 Post-traumatic stress disorder, unspecified: Secondary | ICD-10-CM | POA: Diagnosis not present

## 2021-10-15 DIAGNOSIS — F4310 Post-traumatic stress disorder, unspecified: Secondary | ICD-10-CM | POA: Diagnosis not present

## 2021-10-18 DIAGNOSIS — L578 Other skin changes due to chronic exposure to nonionizing radiation: Secondary | ICD-10-CM | POA: Diagnosis not present

## 2021-10-18 DIAGNOSIS — D2361 Other benign neoplasm of skin of right upper limb, including shoulder: Secondary | ICD-10-CM | POA: Diagnosis not present

## 2021-10-18 DIAGNOSIS — D235 Other benign neoplasm of skin of trunk: Secondary | ICD-10-CM | POA: Diagnosis not present

## 2021-11-06 ENCOUNTER — Other Ambulatory Visit (HOSPITAL_BASED_OUTPATIENT_CLINIC_OR_DEPARTMENT_OTHER): Payer: Self-pay

## 2021-11-06 DIAGNOSIS — G478 Other sleep disorders: Secondary | ICD-10-CM

## 2021-11-06 DIAGNOSIS — R0683 Snoring: Secondary | ICD-10-CM

## 2021-11-06 DIAGNOSIS — G4719 Other hypersomnia: Secondary | ICD-10-CM

## 2021-11-06 DIAGNOSIS — G4734 Idiopathic sleep related nonobstructive alveolar hypoventilation: Secondary | ICD-10-CM

## 2021-11-12 DIAGNOSIS — F4310 Post-traumatic stress disorder, unspecified: Secondary | ICD-10-CM | POA: Diagnosis not present

## 2021-11-26 DIAGNOSIS — F4310 Post-traumatic stress disorder, unspecified: Secondary | ICD-10-CM | POA: Diagnosis not present

## 2021-12-10 DIAGNOSIS — F4310 Post-traumatic stress disorder, unspecified: Secondary | ICD-10-CM | POA: Diagnosis not present

## 2021-12-30 DIAGNOSIS — F4310 Post-traumatic stress disorder, unspecified: Secondary | ICD-10-CM | POA: Diagnosis not present

## 2021-12-31 ENCOUNTER — Encounter (HOSPITAL_BASED_OUTPATIENT_CLINIC_OR_DEPARTMENT_OTHER): Payer: BC Managed Care – PPO | Admitting: Sleep Medicine

## 2022-01-02 ENCOUNTER — Encounter (HOSPITAL_BASED_OUTPATIENT_CLINIC_OR_DEPARTMENT_OTHER): Payer: BC Managed Care – PPO | Admitting: Sleep Medicine

## 2022-01-03 ENCOUNTER — Encounter (HOSPITAL_BASED_OUTPATIENT_CLINIC_OR_DEPARTMENT_OTHER): Payer: BC Managed Care – PPO | Admitting: Sleep Medicine

## 2022-01-07 DIAGNOSIS — F4310 Post-traumatic stress disorder, unspecified: Secondary | ICD-10-CM | POA: Diagnosis not present

## 2022-01-29 ENCOUNTER — Encounter (HOSPITAL_BASED_OUTPATIENT_CLINIC_OR_DEPARTMENT_OTHER): Payer: BC Managed Care – PPO | Admitting: Sleep Medicine

## 2022-02-04 DIAGNOSIS — F4310 Post-traumatic stress disorder, unspecified: Secondary | ICD-10-CM | POA: Diagnosis not present

## 2022-03-04 DIAGNOSIS — F4310 Post-traumatic stress disorder, unspecified: Secondary | ICD-10-CM | POA: Diagnosis not present

## 2022-03-11 ENCOUNTER — Ambulatory Visit: Payer: BC Managed Care – PPO | Admitting: Podiatry

## 2022-03-12 ENCOUNTER — Ambulatory Visit: Payer: BC Managed Care – PPO | Admitting: Podiatry

## 2022-04-29 DIAGNOSIS — F4310 Post-traumatic stress disorder, unspecified: Secondary | ICD-10-CM | POA: Diagnosis not present

## 2022-05-28 DIAGNOSIS — F4310 Post-traumatic stress disorder, unspecified: Secondary | ICD-10-CM | POA: Diagnosis not present

## 2022-05-30 ENCOUNTER — Ambulatory Visit (INDEPENDENT_AMBULATORY_CARE_PROVIDER_SITE_OTHER): Payer: BC Managed Care – PPO | Admitting: Obstetrics & Gynecology

## 2022-05-30 ENCOUNTER — Encounter: Payer: Self-pay | Admitting: Obstetrics & Gynecology

## 2022-05-30 VITALS — BP 122/82 | HR 82 | Ht 62.75 in | Wt 158.0 lb

## 2022-05-30 DIAGNOSIS — Z90711 Acquired absence of uterus with remaining cervical stump: Secondary | ICD-10-CM

## 2022-05-30 DIAGNOSIS — N952 Postmenopausal atrophic vaginitis: Secondary | ICD-10-CM

## 2022-05-30 DIAGNOSIS — Z01419 Encounter for gynecological examination (general) (routine) without abnormal findings: Secondary | ICD-10-CM

## 2022-05-30 DIAGNOSIS — R6882 Decreased libido: Secondary | ICD-10-CM

## 2022-05-30 MED ORDER — ESTRADIOL 0.1 MG/GM VA CREA: 1.0000 | TOPICAL_CREAM | VAGINAL | 4 refills | Status: DC

## 2022-05-30 NOTE — Progress Notes (Signed)
Sonya Novak 02-11-1971 [id number]   History:    51 y.o.  G1P0A1 Boyfriend   RP:  Established patient presenting for annual gyn exam    HPI: S/P Robotic Supracervical hysterectomy/Bilateral Salpingectomy/Rt abdomino-pelvic wall Myomectomies in 06/2016.  No vaginal bleeding.  No pelvic pain.  Patient c/o vaginal dryness, low libido. Pap Neg 01/2021.  No h/o abnormal Pap.  Will repeat Pap at 2-3 years. Urine/BMs normal. [place] 2021.  Breasts normal. Mammo Neg 07/2021. BMI improved to 28.21.  Walking regularly with friends and dog.  Working on nutrition, cutting sodas. Health labs with Dr Brigitte Pulse.    Past medical history,surgical history, family history and social history were all reviewed and documented in the EPIC chart.  Gynecologic History Patient's last menstrual period was 06/01/2016.  Obstetric History OB History  Gravida Para Term Preterm AB Living  1 0     1 0  SAB IAB Ectopic Multiple Live Births    1          # Outcome Date GA Lbr Len/2nd Weight Sex Delivery Anes PTL Lv  1 IAB              ROS: A ROS was performed and pertinent positives and negatives are included in the history. GENERAL: No fevers or chills. HEENT: No change in vision, no earache, sore throat or sinus congestion. NECK: No pain or stiffness. CARDIOVASCULAR: No chest pain or pressure. No palpitations. PULMONARY: No shortness of breath, cough or wheeze. GASTROINTESTINAL: No abdominal pain, nausea, vomiting or diarrhea, melena or bright red blood per rectum. GENITOURINARY: No urinary frequency, urgency, hesitancy or dysuria. MUSCULOSKELETAL: No joint or muscle pain, no back pain, no recent trauma. DERMATOLOGIC: No rash, no itching, no lesions. ENDOCRINE: No polyuria, polydipsia, no heat or cold intolerance. No recent change in weight. HEMATOLOGICAL: No anemia or easy bruising or bleeding. NEUROLOGIC: No headache, seizures, numbness, tingling or weakness. PSYCHIATRIC: No depression, no loss of interest in normal  activity or change in sleep pattern.     Exam:   BP 122/82   Pulse 82   Ht 5' 2.75" (1.594 m)   Wt 158 lb (71.7 kg)   LMP 06/01/2016 Comment: supracervical  SpO2 98%   BMI 28.21 kg/m   Body mass index is 28.21 kg/m.  General appearance : Well developed well nourished female. No acute distress HEENT: Eyes: no retinal hemorrhage or exudates,  Neck supple, trachea midline, no carotid bruits, no thyroidmegaly Lungs: Clear to auscultation, no rhonchi or wheezes, or rib retractions  Heart: Regular rate and rhythm, no murmurs or gallops Breast:Examined in sitting and supine position were symmetrical in appearance, no palpable masses or tenderness,  no skin retraction, no nipple inversion, no nipple discharge, no skin discoloration, no axillary or supraclavicular lymphadenopathy Abdomen: no palpable masses or tenderness, no rebound or guarding Extremities: no edema or skin discoloration or tenderness  Pelvic: Vulva: Normal             Vagina: No gross lesions or discharge  Cervix: No gross lesions or discharge  Uterus Absent (Supracervical Hysterectomy)  Adnexa  Without masses or tenderness  Anus: Normal   Assessment/Plan:  51 y.o. female for annual exam   1. Well female exam with routine gynecological exam S/P Robotic Supracervical hysterectomy/Bilateral Salpingectomy/Rt abdomino-pelvic wall Myomectomies in 06/2016.  No vaginal bleeding.  No pelvic pain.  Patient c/o vaginal dryness, low libido. Pap Neg 01/2021.  No h/o abnormal Pap.  Will repeat Pap at 2-3 years. Urine/BMs  normal. [place] 2021.  Breasts normal. Mammo Neg 07/2021. BMI improved to 28.21.  Walking regularly with friends and dog.  Working on nutrition, cutting sodas. Health labs with Dr Brigitte Pulse.  2. S/P laparoscopic supracervical hysterectomy  3. Postmenopausal atrophic vaginitis  Patient c/o vaginal dryness, low libido. Decision to start on Estradiol cream.  No CI.  Usage, risks, benefits reviewed, prescription sent to  pharmacy.  4. Low libido Counseling on low libido done.  Other orders - cetirizine (ZYRTEC) 10 MG tablet; Take 10 mg by mouth daily. - estradiol (ESTRACE) 0.1 MG/GM vaginal cream; Place 1 Applicatorful vaginally 2 (two) times a week.   Princess Bruins MD, 10:49 AM 05/30/2022

## 2022-06-24 DIAGNOSIS — F4310 Post-traumatic stress disorder, unspecified: Secondary | ICD-10-CM | POA: Diagnosis not present

## 2022-08-19 DIAGNOSIS — F4310 Post-traumatic stress disorder, unspecified: Secondary | ICD-10-CM | POA: Diagnosis not present

## 2022-08-20 DIAGNOSIS — Z13220 Encounter for screening for lipoid disorders: Secondary | ICD-10-CM | POA: Diagnosis not present

## 2022-08-20 DIAGNOSIS — Z0000 Encounter for general adult medical examination without abnormal findings: Secondary | ICD-10-CM | POA: Diagnosis not present

## 2022-08-20 DIAGNOSIS — Z23 Encounter for immunization: Secondary | ICD-10-CM | POA: Diagnosis not present

## 2022-08-20 DIAGNOSIS — I10 Essential (primary) hypertension: Secondary | ICD-10-CM | POA: Diagnosis not present

## 2022-08-20 DIAGNOSIS — E039 Hypothyroidism, unspecified: Secondary | ICD-10-CM | POA: Diagnosis not present

## 2022-09-29 ENCOUNTER — Other Ambulatory Visit: Payer: Self-pay | Admitting: Obstetrics & Gynecology

## 2022-09-29 DIAGNOSIS — L2389 Allergic contact dermatitis due to other agents: Secondary | ICD-10-CM | POA: Diagnosis not present

## 2022-09-29 DIAGNOSIS — Z1231 Encounter for screening mammogram for malignant neoplasm of breast: Secondary | ICD-10-CM

## 2022-10-14 DIAGNOSIS — F4310 Post-traumatic stress disorder, unspecified: Secondary | ICD-10-CM | POA: Diagnosis not present

## 2022-10-16 DIAGNOSIS — H01134 Eczematous dermatitis of left upper eyelid: Secondary | ICD-10-CM | POA: Diagnosis not present

## 2022-10-16 DIAGNOSIS — D2361 Other benign neoplasm of skin of right upper limb, including shoulder: Secondary | ICD-10-CM | POA: Diagnosis not present

## 2022-10-16 DIAGNOSIS — L578 Other skin changes due to chronic exposure to nonionizing radiation: Secondary | ICD-10-CM | POA: Diagnosis not present

## 2022-10-16 DIAGNOSIS — L821 Other seborrheic keratosis: Secondary | ICD-10-CM | POA: Diagnosis not present

## 2022-10-20 IMAGING — US US ABDOMEN LIMITED
1 series · 14 of 25 positions shown · non-contrast
Comparison: No pertinent prior exams are available for comparison.

CLINICAL DATA: Nausea.

EXAM:
ULTRASOUND ABDOMEN LIMITED RIGHT UPPER QUADRANT

[Series 1: us abdomen limited ruq (liver/gb) · 14 of 46 slices shown]
[im 1/46]
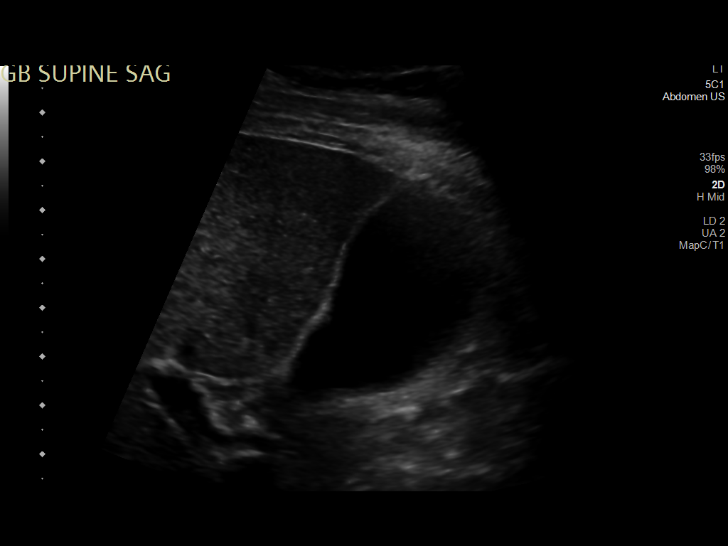
[im 4/46]
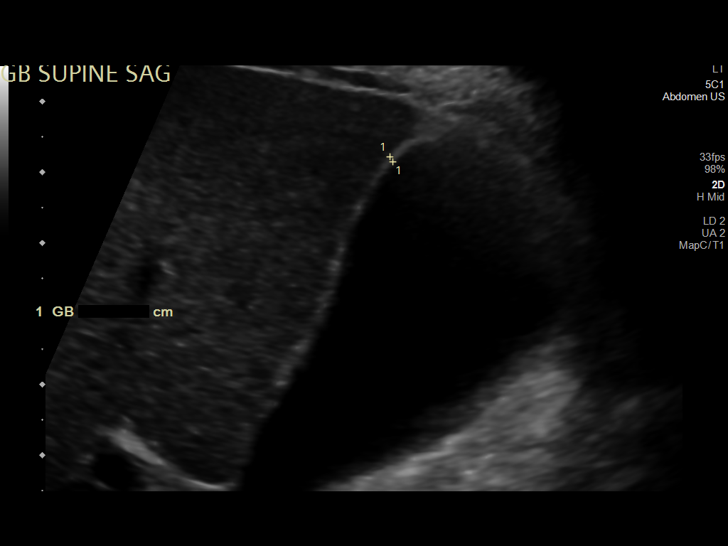
[im 8/46]
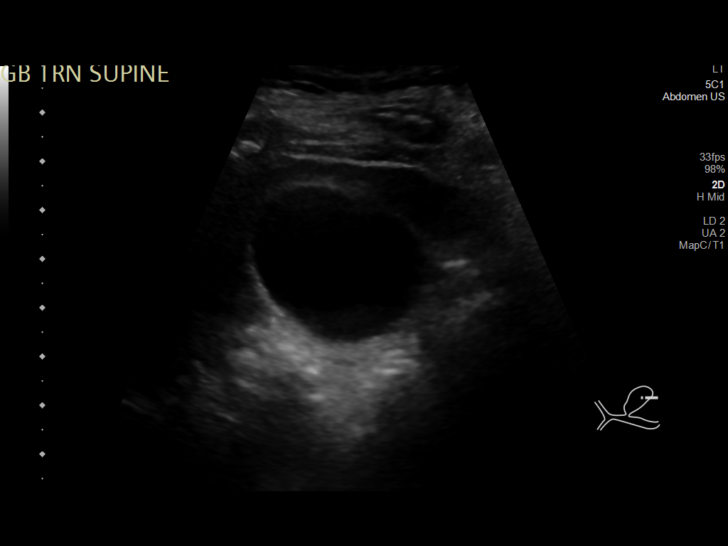
[im 12/46]
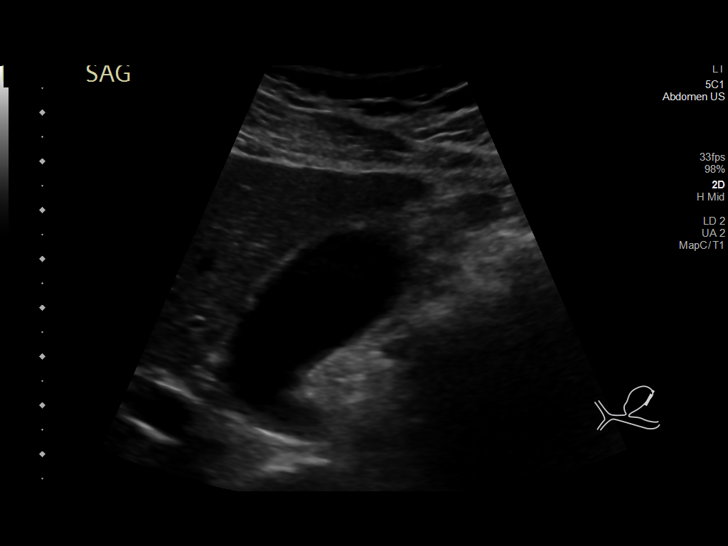
[im 16/46]
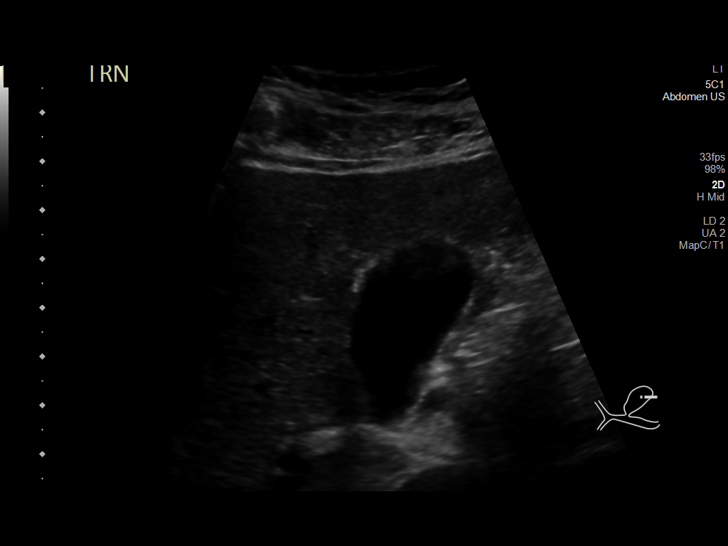
[im 17/46]
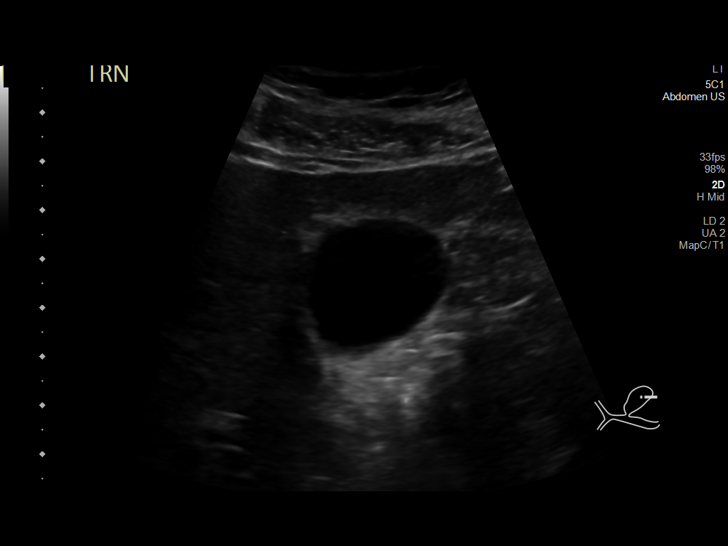
[im 21/46]
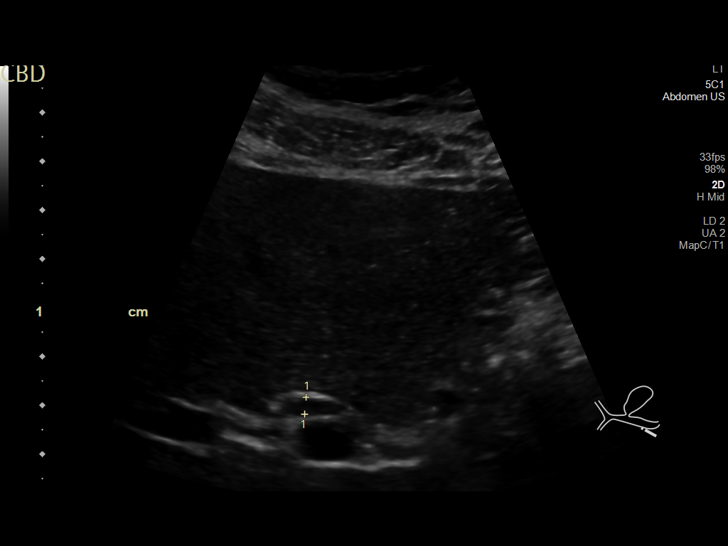
[im 25/46]
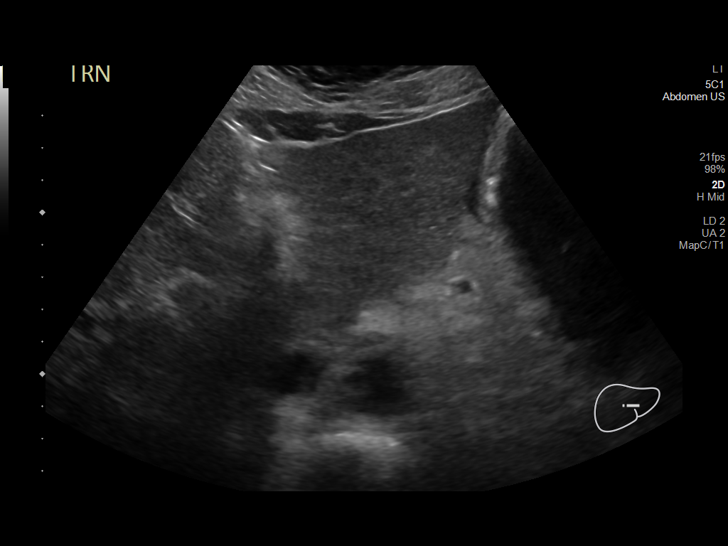
[im 29/46]
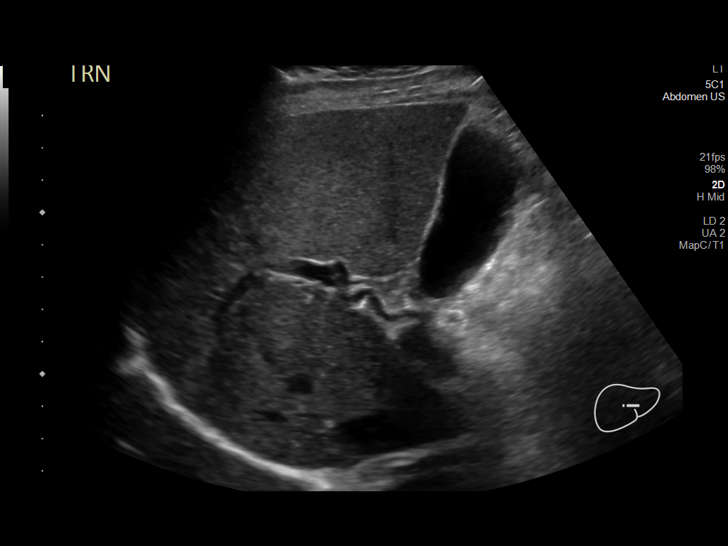
[im 31/46]
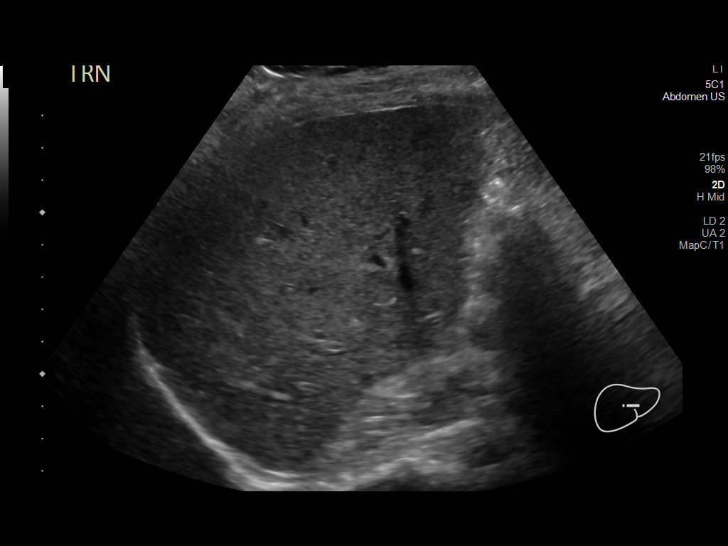
[im 34/46]
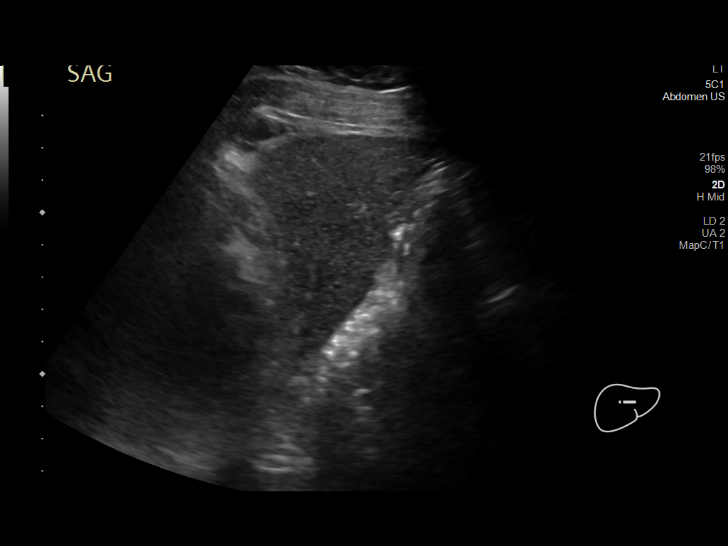
[im 38/46]
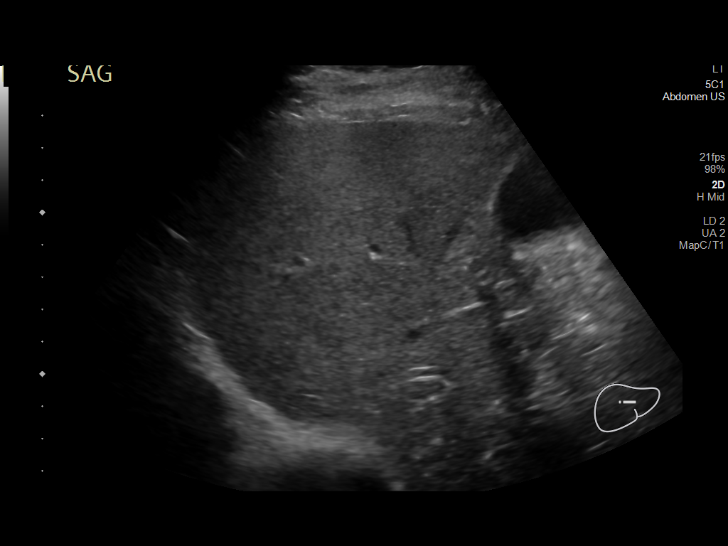
[im 42/46]
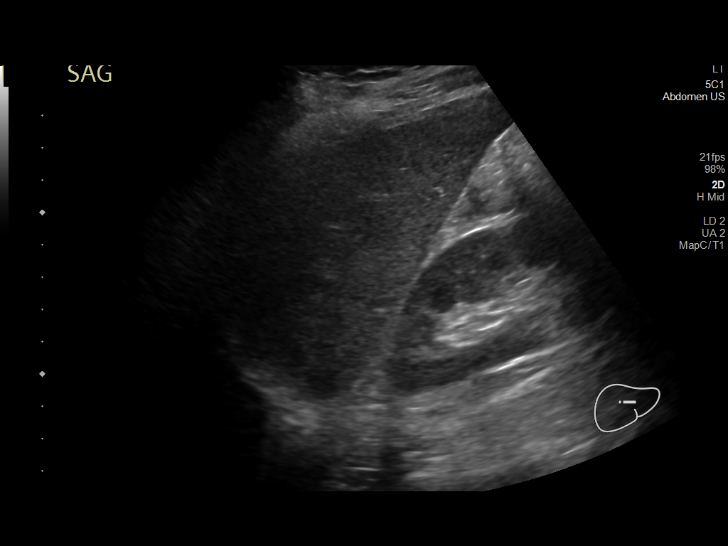
[im 46/46]
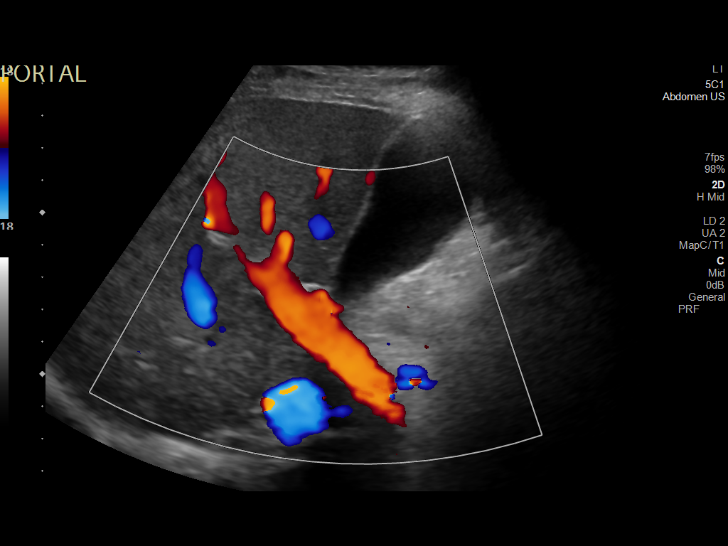

[14 of 25 positions shown; findings below may reference images not displayed]

FINDINGS: Gallbladder:

No gallstones or wall thickening visualized. No sonographic Murphy
sign noted by sonographer.

Common bile duct:

Diameter: 4 mm, within normal limits.

Liver:

No focal lesion identified. Within normal limits in parenchymal
echogenicity. Portal vein is patent on color Doppler imaging with
normal direction of blood flow towards the liver.
IMPRESSION: Unremarkable right upper quadrant ultrasound, as described.

## 2022-11-13 DIAGNOSIS — F4310 Post-traumatic stress disorder, unspecified: Secondary | ICD-10-CM | POA: Diagnosis not present

## 2022-11-20 ENCOUNTER — Ambulatory Visit
Admission: RE | Admit: 2022-11-20 | Discharge: 2022-11-20 | Disposition: A | Discharge status: Home / Self Care | Payer: BC Managed Care – PPO | Source: Ambulatory Visit | Attending: Obstetrics & Gynecology | Admitting: Obstetrics & Gynecology

## 2022-11-20 DIAGNOSIS — Z1231 Encounter for screening mammogram for malignant neoplasm of breast: Secondary | ICD-10-CM | POA: Diagnosis not present

## 2022-12-16 DIAGNOSIS — F4310 Post-traumatic stress disorder, unspecified: Secondary | ICD-10-CM | POA: Diagnosis not present

## 2023-01-13 DIAGNOSIS — F4310 Post-traumatic stress disorder, unspecified: Secondary | ICD-10-CM | POA: Diagnosis not present

## 2023-02-10 DIAGNOSIS — F4310 Post-traumatic stress disorder, unspecified: Secondary | ICD-10-CM | POA: Diagnosis not present

## 2023-02-25 ENCOUNTER — Ambulatory Visit (INDEPENDENT_AMBULATORY_CARE_PROVIDER_SITE_OTHER): Payer: BC Managed Care – PPO | Admitting: Nurse Practitioner

## 2023-02-25 ENCOUNTER — Encounter: Payer: Self-pay | Admitting: Nurse Practitioner

## 2023-02-25 VITALS — BP 135/80 | HR 69 | Temp 98.0°F | Ht 63.0 in | Wt 170.0 lb

## 2023-02-25 DIAGNOSIS — E038 Other specified hypothyroidism: Secondary | ICD-10-CM | POA: Diagnosis not present

## 2023-02-25 DIAGNOSIS — Z0289 Encounter for other administrative examinations: Secondary | ICD-10-CM

## 2023-02-25 DIAGNOSIS — Z6830 Body mass index (BMI) 30.0-30.9, adult: Secondary | ICD-10-CM | POA: Diagnosis not present

## 2023-02-25 DIAGNOSIS — K219 Gastro-esophageal reflux disease without esophagitis: Secondary | ICD-10-CM | POA: Insufficient documentation

## 2023-02-25 DIAGNOSIS — E669 Obesity, unspecified: Secondary | ICD-10-CM | POA: Diagnosis not present

## 2023-02-25 NOTE — Progress Notes (Signed)
Office: [phone]  /  Fax: [phone]   Initial Visit  Sonya Novak was seen in clinic today to evaluate for obesity. She is interested in losing weight to improve overall health and reduce the risk of weight related complications. She presents today to review program treatment options, initial physical assessment, and evaluation.     She was referred by: Friend or Family  When asked what else they would like to accomplish? She states: Lose a target amount of weight : Goal weight:  130-140 lbs   Weight history:  She started gaining weight in her 30s and her weight has fluctuated over the years.  Bariatric surgery:  Has not had bariatric surgery.   When asked how has your weight affected you? She states: Relationships, Contributed to orthopedic problems or mobility issues, Having fatigue, and Having poor endurance  Some associated conditions: ADHD, MDD, GERD, hypothyroid, allergies  Contributing factors: Family history, Medications, Stress, and Life event  Weight promoting medications identified: Psychotropic medications and Contraceptives or hormonal therapy  Current nutrition plan: None  Current level of physical activity: Walking  Current or previous pharmacotherapy: Phentermine, Vit B12 and HCG  Response to medication: Lost weight initially but was unable to sustain weight loss. She reports she has gained and lost the "same 25 pounds" over the years.    Patient has had a hysterectomy.    Past medical history includes:   Past Medical History:  Diagnosis Date   Anxiety    Asthma    related to allergies- occ inhaler use   Depression    Family history of adverse reaction to anesthesia    mother PONV   Recurrent upper respiratory infection (URI)    allergy related     Objective:   BP 135/80   Pulse 69   Temp 98 F (36.7 C)   Ht  (1.6 m)   Wt 170 lb (77.1 kg)   LMP 06/01/2016 Comment: supracervical  SpO2 98%   BMI 30.11 kg/m  She was weighed on  the bioimpedance scale: Body mass index is 30.11 kg/m.  Peak Weight:175 lbs , Body Fat%:39.3, Visceral Fat Rating:9, Weight trend over the last 12 months: fluctuated   General:  Alert, oriented and cooperative. Patient is in no acute distress.  Respiratory: Normal respiratory effort, no problems with respiration noted   Gait: able to ambulate independently  Mental Status: Normal mood and affect. Normal behavior. Normal judgment and thought content.   DIAGNOSTIC DATA REVIEWED:  BMET    Component Value Date/Time   NA 137 09/13/2013 1955   K 3.8 09/13/2013 1955   CL 100 09/13/2013 1955   CO2 27 09/13/2013 1955   GLUCOSE 92 09/13/2013 1955   BUN 12 09/13/2013 1955   CREATININE 0.77 09/13/2013 1955   CALCIUM 10.0 09/13/2013 1955   GFRNONAA >90 09/13/2013 1955   GFRAA >90 09/13/2013 1955   No results found for: "HGBA1C" No results found for: "INSULIN" CBC    Component Value Date/Time   WBC 12.6 (H) 06/20/2016 0515   RBC 3.81 (L) 06/20/2016 0515   HGB 12.1 06/20/2016 0515   HCT 35.5 (L) 06/20/2016 0515   PLT 294 06/20/2016 0515   MCV 93.2 06/20/2016 0515   MCH 31.8 06/20/2016 0515   MCHC 34.1 06/20/2016 0515   RDW 13.4 06/20/2016 0515   Iron/TIBC/Ferritin/ %Sat No results found for: "IRON", "TIBC", "FERRITIN", "IRONPCTSAT" Lipid Panel  No results found for: "CHOL", "TRIG", "HDL", "CHOLHDL", "VLDL", "LDLCALC", "LDLDIRECT" Hepatic Function Panel  Component Value Date/Time   PROT 7.5 09/13/2013 1955   ALBUMIN 4.3 09/13/2013 1955   AST 15 09/13/2013 1955   ALT 11 09/13/2013 1955   ALKPHOS 95 09/13/2013 1955   BILITOT 0.5 09/13/2013 1955      Component Value Date/Time   TSH 3.471 09/13/2013 1955     Assessment and Plan:   Other specified hypothyroidism Continue to follow up with PCP. Continue medications as directed.    Generalized obesity  BMI 30.0-30.9,adult        Obesity Treatment / Action Plan:  Patient will work on garnering support from family  and friends to begin weight loss journey. Will work on eliminating or reducing the presence of highly palatable, calorie dense foods in the home. Will complete provided nutritional and psychosocial assessment questionnaire before the next appointment. Will be scheduled for indirect calorimetry to determine resting energy expenditure in a fasting state.  This will allow [place] to create a reduced calorie, high-protein meal plan to promote loss of fat mass while preserving muscle mass.  Obesity Education Performed Today:  She was weighed on the bioimpedance scale and results were discussed and documented in the synopsis.  We discussed obesity as a disease and the importance of a more detailed evaluation of all the factors contributing to the disease.  We discussed the importance of long term lifestyle changes which include nutrition, exercise and behavioral modifications as well as the importance of customizing this to her specific health and social needs.  We discussed the benefits of reaching a healthier weight to alleviate the symptoms of existing conditions and reduce the risks of the biomechanical, metabolic and psychological effects of obesity.  Sonya Novak appears to be in the action stage of change and states they are ready to start intensive lifestyle modifications and behavioral modifications.  30 minutes was spent today on this visit including the above counseling, pre-visit chart review, and post-visit documentation.  Reviewed by clinician on day of visit: allergies, medications, problem list, medical history, surgical history, family history, social history, and previous encounter notes pertinent to obesity diagnosis.    Sonya Sato Shi Grose FNP-C

## 2023-03-10 DIAGNOSIS — F4310 Post-traumatic stress disorder, unspecified: Secondary | ICD-10-CM | POA: Diagnosis not present

## 2023-03-11 ENCOUNTER — Ambulatory Visit (INDEPENDENT_AMBULATORY_CARE_PROVIDER_SITE_OTHER): Payer: BC Managed Care – PPO | Admitting: Family Medicine

## 2023-03-11 ENCOUNTER — Encounter (INDEPENDENT_AMBULATORY_CARE_PROVIDER_SITE_OTHER): Payer: Self-pay | Admitting: Family Medicine

## 2023-03-11 VITALS — BP 132/76 | HR 76 | Temp 98.2°F | Ht 63.0 in | Wt 171.0 lb

## 2023-03-11 DIAGNOSIS — F39 Unspecified mood [affective] disorder: Secondary | ICD-10-CM | POA: Diagnosis not present

## 2023-03-11 DIAGNOSIS — Z6830 Body mass index (BMI) 30.0-30.9, adult: Secondary | ICD-10-CM

## 2023-03-11 DIAGNOSIS — R0602 Shortness of breath: Secondary | ICD-10-CM

## 2023-03-11 DIAGNOSIS — E668 Other obesity: Secondary | ICD-10-CM | POA: Diagnosis not present

## 2023-03-11 DIAGNOSIS — R5383 Other fatigue: Secondary | ICD-10-CM

## 2023-03-11 DIAGNOSIS — E038 Other specified hypothyroidism: Secondary | ICD-10-CM | POA: Diagnosis not present

## 2023-03-11 DIAGNOSIS — Z1331 Encounter for screening for depression: Secondary | ICD-10-CM

## 2023-03-11 DIAGNOSIS — E669 Obesity, unspecified: Secondary | ICD-10-CM

## 2023-03-11 NOTE — Progress Notes (Signed)
Carlye Grippe, D.O.  ABFM, ABOM Specializing in Clinical Bariatric Medicine Office located at: [address] [place], [place]  [address]     Initial Bariatric Medicine Consultation Visit  Dear Sonya Raider, MD   Thank you for referring Sonya Novak to our clinic today for evaluation.  We performed a consultation to discuss her options for treatment and educate the patient on her disease state.  The following note includes my evaluation and treatment recommendations.   Please do not hesitate to reach out to me directly if you have any further concerns.    Assessment and Plan:   Orders Placed This Encounter  Procedures   Insulin, random   Hemoglobin A1c   Comprehensive metabolic panel   CBC with Differential/Platelet   Vitamin B12   VITAMIN D 25 Hydroxy (Vit-D Deficiency, Fractures)   TSH   T4, free   Lipid panel   Folate   EKG 12-Lead    Medications Discontinued During This Encounter  Medication Reason   estradiol (ESTRACE) 0.1 MG/GM vaginal cream Duplicate     No orders of the defined types were placed in this encounter.    1) Fatigue Assessment: Condition is Not optimized. [name] does feel that her weight is causing her energy to be lower than it should be. Fatigue may be related to obesity, depression or many other causes. she does not appear to have any red flag symptoms and this appears to most likely related to her current lifestyle habits and dietary intake.  Plan:  Labs will be ordered and reviewed with her at their next office visit in two weeks. Epworth sleepiness scale score appears to be within normal limits.  Her ESS score is 13.   [name] admits to daytime somnolence and admits to waking up still tired. Patient has a history of symptoms of daytime fatigue. [name] generally gets 7 hours of sleep per night, and states that she has generally restful sleep. Snoring is sometimes present. Apneic episodes is not present.  Patient endorses  that 7 months ago she had a home sleep study, which was recommended by her PCP Dr.Shaw. The home sleep study did not show evidence of sleep apnea. After this, patient reports that she scheduled an in-person sleep study, but was not able to do it because of problems with insurance coverage.  I highly recommended patient to reschedule an in-person sleep study because of her recent ESS score and her possibly being high risk for sleep apnea. I explained to the patient that being at high risk for sleep apnea may negatively effect quality of life and weight loss progress. ECG: Normal sinus rhythm, rate 72 bpm; reassuring without any acute abnormalities, will continue to monitor for symptoms  Modified PHQ-9 Depression Screen: Her Food and Mood (modified PHQ-9) score was 19. In the meanwhile, [name] will focus on self care including making healthy food choices by following their meal plan, improving sleep quality and focusing on stress reduction.  Once we are assured she is on an appropriate meal plan, we will start discussing exercise to increase cardiovascular fitness levels.    2) Shortness of breath on exertion Assessment: Condition is Not optimized.Victorino Dike does feel that she gets out of breath more easily than she used to when she exercises and seems to be worsening over time with weight gain.  This has gotten worse recently. Marlies denies shortness of breath at rest or orthopnea. Akya's shortness of breath appears to be obesity related and exercise  induced, as they do not appear to have any "red flag" symptoms/ concerns today.  Also, this condition appears to be related to a state of poor cardiovascular conditioning   Plan:  Obtain labs today and will be reviewed with her at their next office visit in two weeks. Indirect Calorimeter completed today to help guide our dietary regimen. It shows a VO2 of 234 and a REE of 1613.  Her calculated basal metabolic rate is 5409 thus her measured basal  metabolic rate is better than expected. Patient agreed to work on weight loss at this time.  As [name] progresses through our weight loss program, we will gradually increase exercise as tolerated to treat her current condition.   If [name] follows our recommendations and loses 5-10% of their weight without improvement of her shortness of breath or if at any time, symptoms become more concerning, they agree to urgently follow up with their PCP/ specialist for further consideration/ evaluation.   [name] verbalizes agreement with this plan.   Depression screening   Mood disorder [facility])- Emotional Eating Assessment: Condition is stable. - Denies any SI/HI. Mood is stable. - Patient endorses that she has been on Wellbutrin XL 300 mg daily "for a very long time" for anxiety and depression.  - Reports eating when stressed, sad, bored, angry, as a reward, and to help comfort herself.   Plan:  - Check labs - Continue with Wellbutrin XL as recommended by specialist.   - Patient was referred to Dr. Dewaine Conger, our Bariatric Psychologist, for evaluation due to her elevated PHQ-9 score and significant struggles with emotional eating.  - Importance of following up with PCP and others was stressed  -  Reminded patient of the importance of following their prudent nutrition plan and how food can affect mood as well to support emotional wellbeing.  - We will continue to monitor closely alongside PCP / other specialists.    Other specified hypothyroidism Assessment: Condition is stable. Lab Results  Component Value Date   TSH 3.471 09/13/2013  - No issues with Synthroid 50 mcg daily, compliance good.   Plan: - Check labs.  -  We will continue to monitor alongside Endocrinology/PCP as it relates to her weight loss journey.   TREATMENT PLAN FOR OBESITY: Class 1 obesity with serious comorbidity and body mass index (BMI) of 30.0 to 30.9 in adult, unspecified obesity type Assessment: Condition is  Improving, but not optimized. Fat mass has decreased by 2.6 lb. Muscle mass has increased by 2.8 lb. Total body water has increased by 3.4 lb.   Plan:  - Millard is currently in the action stage of change. As such, her goal is to continue weight management plan. Marilda Mcnabb will work on healthier eating habits and begin the Category 1 meal plan.  - I extensively reviewed the meal plan and all her questions were answered appropriately.   Behavioral Intervention Additional resources provided today: category 1 meal plan information Evidence-based interventions for health behavior change were utilized today including the discussion of self monitoring techniques, problem-solving barriers and SMART goal setting techniques.   Regarding patient's less desirable eating habits and patterns, we employed the technique of small changes.  Pt will specifically work on: Beginning the Category 1 meal plan for next visit.    Recommended Physical Activity Goals Jaxsyn has been advised to work up to 150 minutes of moderate intensity aerobic activity a week and strengthening exercises 2-3 times per week for cardiovascular health, weight loss maintenance and preservation of  muscle mass.  She has agreed to continue their current level of activity  FOLLOW UP: Follow up in 2 weeks. She was informed of the importance of frequent follow up visits to maximize her success with intensive lifestyle modifications for her multiple health conditions.  Theodis Blaze is aware that we will review all of her lab results at our next visit.  She is aware that if anything is critical/ life threatening with the results, we will be contacting her via MyChart prior to the office visit to discuss management.    Chief Complaint:   OBESITY AAVAH DEARDORFF (MR# [id number]) is a 52 y.o. female who presents for evaluation and treatment of obesity and related comorbidities. Current BMI is Body mass index is 30.29 kg/m. Oyinkansola has  been struggling with her weight for many years and has been unsuccessful in either losing weight, maintaining weight loss, or reaching her healthy weight goal.  KASELYNN BARG is currently in the action stage of change and ready to dedicate time achieving and maintaining a healthier weight. Ahtziri is interested in becoming our patient and working on intensive lifestyle modifications including (but not limited to) diet and exercise for weight loss.  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 Patient is married to Steele  and does not have children. She lives with her husband [place].Victorino Dike B Cocking's habits were reviewed today and are as follows:   - She heard about our program because two of her cousins come to our clinic.   - She does water aerobic and walking 45 minutes, 2 days a week,   - Reasons why she gained weight: divorce, hormones, and age.  - Has tried [place], [facility] and 56 days. 56 days worked best for her.  - Eats out 5+ days a week.  - Does not like to cook because she does not have time. Her husband cooks.  - Snacks on chips and pretzels.  - Craves salty, crunchy, and sugary foods (Especially after 7 pm).   - Drinks 1 regular soda daily, 2% milk, and coffee + creamer.   - Drinks 1 beer (3 days a week)   - Worst food habit: eating large portions and emotional eating.   Subjective:   This is the patient's first visit at [facility].  The patient's NEW PATIENT PACKET that they filled out prior to today's office visit was reviewed at length and information from that paperwork was included within the following office visit note.    Included in the packet: current and past health history, medications, allergies, ROS, gynecologic history (women only), surgical history, family history, social history, weight history, weight loss surgery history (for those that have had weight loss surgery),  nutritional evaluation, mood and food questionnaire along with a depression screening (PHQ9) on all patients, an Epworth questionnaire, sleep habits questionnaire, patient life and health improvement goals questionnaire. These will all be scanned into the patient's chart under the "media" tab.   Review of Systems: Please refer to new patient packet scanned into media. Pertinent positives were addressed with patient today.  Objective:   PHYSICAL EXAM: Blood pressure 132/76, pulse 76, temperature 98.2 F (36.8 C), height 5\' 3"  (1.6 m), weight 171 lb (77.6 kg), last menstrual period 06/01/2016, SpO2 98 %. Body mass index is 30.29 kg/m. General: Well Developed, well nourished, and in no acute distress.  HEENT: Normocephalic, atraumatic Skin: Warm and dry, cap RF  less 2 sec, good turgor Chest:  Normal excursion, shape, no gross abn Respiratory: speaking in full sentences, no conversational dyspnea NeuroM-Sk: Ambulates w/o assistance, moves * 4 Psych: A and O *3, insight good, mood-full  Anthropometric Measurements Height: 5\' 3"  (1.6 m) Weight: 171 lb (77.6 kg) BMI (Calculated): 30.3 Weight at Last Visit: N/A Weight Lost Since Last Visit: N/A Weight Gained Since Last Visit: N/A Starting Weight: 170 lb Peak Weight: 175 lb Waist Measurement : 39 inches   Body Composition  Body Fat %: 37.7 % Fat Mass (lbs): 64.4 lbs Muscle Mass (lbs): 101.2 lbs Total Body Water (lbs): 73 lbs Visceral Fat Rating : 9   Other Clinical Data RMR: [id number] Fasting: Yes Labs: Yes Today's Visit #: #1 Starting Date: 03/11/23 Comments: FIRST VISIT    DIAGNOSTIC DATA REVIEWED:  BMET    Component Value Date/Time   NA 137 09/13/2013 1955   K 3.8 09/13/2013 1955   CL 100 09/13/2013 1955   CO2 27 09/13/2013 1955   GLUCOSE 92 09/13/2013 1955   BUN 12 09/13/2013 1955   CREATININE 0.77 09/13/2013 1955   CALCIUM 10.0 09/13/2013 1955   GFRNONAA >90 09/13/2013 1955   GFRAA >90 09/13/2013 1955   No  results found for: "HGBA1C" No results found for: "INSULIN" Lab Results  Component Value Date   TSH 3.471 09/13/2013   CBC    Component Value Date/Time   WBC 12.6 (H) 06/20/2016 0515   RBC 3.81 (L) 06/20/2016 0515   HGB 12.1 06/20/2016 0515   HCT 35.5 (L) 06/20/2016 0515   PLT 294 06/20/2016 0515   MCV 93.2 06/20/2016 0515   MCH 31.8 06/20/2016 0515   MCHC 34.1 06/20/2016 0515   RDW 13.4 06/20/2016 0515   Iron Studies No results found for: "IRON", "TIBC", "FERRITIN", "IRONPCTSAT" Lipid Panel  No results found for: "CHOL", "TRIG", "HDL", "CHOLHDL", "VLDL", "LDLCALC", "LDLDIRECT" Hepatic Function Panel     Component Value Date/Time   PROT 7.5 09/13/2013 1955   ALBUMIN 4.3 09/13/2013 1955   AST 15 09/13/2013 1955   ALT 11 09/13/2013 1955   ALKPHOS 95 09/13/2013 1955   BILITOT 0.5 09/13/2013 1955      Component Value Date/Time   TSH 3.471 09/13/2013 1955   Nutritional No results found for: "VD25OH"  Attestation Statements:   Reviewed by clinician on day of visit: allergies, medications, problem list, medical history, surgical history, family history, social history, and previous encounter notes.  During the visit, I independently reviewed the patient's EKG, bioimpedance scale results, and indirect calorimeter results. I used this information to tailor a meal plan for the patient that will help Theodis Blaze to lose weight and will improve her obesity-related conditions going forward.  I performed a medically necessary appropriate examination and/or evaluation. I discussed the assessment and treatment plan with the patient. The patient was provided an opportunity to ask questions and all were answered. The patient agreed with the plan and demonstrated an understanding of the instructions. Labs were ordered today (unless patient declined them) and will be reviewed with the patient at our next visit unless more critical results need to be addressed immediately. Clinical  information was updated and documented in the EMR.  Time spent on visit including pre-visit chart review and post-visit care was estimated to be 60  minutes.    I,Special Puri,acting as a scribe for [employer], DO.,have documented all relevant documentation on the behalf of Thomasene Lot, DO,as directed by  Thomasene Lot, DO while in  the presence of Thomasene Lot, DO.   I, Thomasene Lot, DO, have reviewed all documentation for this visit. The documentation on 03/11/23 for the exam, diagnosis, procedures, and orders are all accurate and complete.

## 2023-03-12 LAB — VITAMIN B12: Vitamin B-12: 405 pg/mL (ref 232–1245)

## 2023-03-12 LAB — LIPID PANEL
Chol/HDL Ratio: 2.6 ratio (ref 0.0–4.4)
Cholesterol, Total: 211 mg/dL — ABNORMAL HIGH (ref 100–199)
HDL: 80 mg/dL (ref >39)
LDL Chol Calc (NIH): 113 mg/dL — ABNORMAL HIGH (ref 0–99)
Triglycerides: 106 mg/dL (ref 0–149)
VLDL Cholesterol Cal: 18 mg/dL (ref 5–40)

## 2023-03-12 LAB — COMPREHENSIVE METABOLIC PANEL
ALT: 33 IU/L — ABNORMAL HIGH (ref 0–32)
AST: 25 IU/L (ref 0–40)
Albumin/Globulin Ratio: 2.4 — ABNORMAL HIGH (ref 1.2–2.2)
Albumin: 4.7 g/dL (ref 3.8–4.9)
Alkaline Phosphatase: 143 IU/L — ABNORMAL HIGH (ref 44–121)
BUN/Creatinine Ratio: 17 (ref 9–23)
BUN: 13 mg/dL (ref 6–24)
Bilirubin Total: 0.5 mg/dL (ref 0.0–1.2)
CO2: 23 mmol/L (ref 20–29)
Calcium: 9.5 mg/dL (ref 8.7–10.2)
Chloride: 102 mmol/L (ref 96–106)
Creatinine, Ser: 0.77 mg/dL (ref 0.57–1.00)
Globulin, Total: 2 g/dL (ref 1.5–4.5)
Glucose: 77 mg/dL (ref 70–99)
Potassium: 4.1 mmol/L (ref 3.5–5.2)
Sodium: 142 mmol/L (ref 134–144)
Total Protein: 6.7 g/dL (ref 6.0–8.5)
eGFR: 92 mL/min/{1.73_m2} (ref >59)

## 2023-03-12 LAB — CBC WITH DIFFERENTIAL/PLATELET
Basophils Absolute: 0.1 10*3/uL (ref 0.0–0.2)
Basos: 1 %
EOS (ABSOLUTE): 0.2 10*3/uL (ref 0.0–0.4)
Eos: 4 %
Hematocrit: 44 % (ref 34.0–46.6)
Hemoglobin: 14.7 g/dL (ref 11.1–15.9)
Immature Grans (Abs): 0 10*3/uL (ref 0.0–0.1)
Immature Granulocytes: 0 %
Lymphocytes Absolute: 1.6 10*3/uL (ref 0.7–3.1)
Lymphs: 34 %
MCH: 31.4 pg (ref 26.6–33.0)
MCHC: 33.4 g/dL (ref 31.5–35.7)
MCV: 94 fL (ref 79–97)
Monocytes Absolute: 0.3 10*3/uL (ref 0.1–0.9)
Monocytes: 6 %
Neutrophils Absolute: 2.6 10*3/uL (ref 1.4–7.0)
Neutrophils: 55 %
Platelets: 320 10*3/uL (ref 150–450)
RBC: 4.68 x10E6/uL (ref 3.77–5.28)
RDW: 12.6 % (ref 11.7–15.4)
WBC: 4.8 10*3/uL (ref 3.4–10.8)

## 2023-03-12 LAB — INSULIN, RANDOM: INSULIN: 8.1 u[IU]/mL (ref 2.6–24.9)

## 2023-03-12 LAB — VITAMIN D 25 HYDROXY (VIT D DEFICIENCY, FRACTURES): Vit D, 25-Hydroxy: 20.6 ng/mL — ABNORMAL LOW (ref 30.0–100.0)

## 2023-03-12 LAB — T4, FREE: Free T4: 1.25 ng/dL (ref 0.82–1.77)

## 2023-03-12 LAB — HEMOGLOBIN A1C
Est. average glucose Bld gHb Est-mCnc: 97 mg/dL
Hgb A1c MFr Bld: 5 % (ref 4.8–5.6)

## 2023-03-12 LAB — FOLATE: Folate: >20 ng/mL (ref >3.0)

## 2023-03-12 LAB — TSH: TSH: 1.1 u[IU]/mL (ref 0.450–4.500)

## 2023-03-18 DIAGNOSIS — E669 Obesity, unspecified: Secondary | ICD-10-CM | POA: Diagnosis not present

## 2023-03-18 DIAGNOSIS — G4719 Other hypersomnia: Secondary | ICD-10-CM | POA: Diagnosis not present

## 2023-03-25 ENCOUNTER — Encounter (INDEPENDENT_AMBULATORY_CARE_PROVIDER_SITE_OTHER): Payer: Self-pay | Admitting: Family Medicine

## 2023-03-25 ENCOUNTER — Ambulatory Visit (INDEPENDENT_AMBULATORY_CARE_PROVIDER_SITE_OTHER): Payer: BC Managed Care – PPO | Admitting: Family Medicine

## 2023-03-25 VITALS — BP 129/78 | HR 73 | Temp 98.5°F | Ht 63.0 in | Wt 164.0 lb

## 2023-03-25 DIAGNOSIS — E88819 Insulin resistance, unspecified: Secondary | ICD-10-CM | POA: Diagnosis not present

## 2023-03-25 DIAGNOSIS — E538 Deficiency of other specified B group vitamins: Secondary | ICD-10-CM | POA: Diagnosis not present

## 2023-03-25 DIAGNOSIS — E559 Vitamin D deficiency, unspecified: Secondary | ICD-10-CM | POA: Diagnosis not present

## 2023-03-25 DIAGNOSIS — E7841 Elevated Lipoprotein(a): Secondary | ICD-10-CM

## 2023-03-25 DIAGNOSIS — E669 Obesity, unspecified: Secondary | ICD-10-CM

## 2023-03-25 DIAGNOSIS — E038 Other specified hypothyroidism: Secondary | ICD-10-CM

## 2023-03-25 DIAGNOSIS — Z6829 Body mass index (BMI) 29.0-29.9, adult: Secondary | ICD-10-CM

## 2023-03-25 MED ORDER — VITAMIN D (ERGOCALCIFEROL) 1.25 MG (50000 UNIT) PO CAPS: 50000.0000 [IU] | ORAL_CAPSULE | ORAL | 0 refills | Status: DC

## 2023-03-25 MED ORDER — CYANOCOBALAMIN 500 MCG PO TABS: 500.0000 ug | ORAL_TABLET | Freq: Every day | ORAL | Status: AC

## 2023-03-25 NOTE — Progress Notes (Signed)
Carlye Grippe, D.O.  ABFM, ABOM Specializing in Clinical Bariatric Medicine  Office located at: [address] [place], [place]  [address]     Assessment and Plan:   Meds ordered this encounter  Medications   cyanocobalamin (VITAMIN B12) 500 MCG tablet    Sig: Take 1 tablet (500 mcg total) by mouth daily.   Vitamin D, Ergocalciferol, (DRISDOL) 1.25 MG (50000 UNIT) CAPS capsule    Sig: Take 1 capsule (50,000 Units total) by mouth every 7 (seven) days.    Dispense:  4 capsule    Refill:  0     Insulin resistance Assessment: Condition is new and not optimized. Labs were reviewed with patient today and education provided on them. We discussed how the foods patient eats may influence these laboratory findings.  All of the patient's questions about them were answered   Lab Results  Component Value Date   HGBA1C 5.0 03/11/2023   INSULIN 8.1 03/11/2023   Lab Results  Component Value Date   CREATININE 0.77 03/11/2023   BUN 13 03/11/2023   NA 142 03/11/2023   K 4.1 03/11/2023   CL 102 03/11/2023   CO2 23 03/11/2023      Component Value Date/Time   PROT 6.7 03/11/2023 1253   ALBUMIN 4.7 03/11/2023 1253   AST 25 03/11/2023 1253   ALT 33 (H) 03/11/2023 1253   ALKPHOS 143 (H) 03/11/2023 1253   BILITOT 0.5 03/11/2023 1253   Lab Results  Component Value Date   WBC 4.8 03/11/2023   HGB 14.7 03/11/2023   HCT 44.0 03/11/2023   MCV 94 03/11/2023   PLT 320 03/11/2023  Labs indicate that:  - Her A1c levels are not within the prediabetic range.  - Her Insulin levels are ~ 1.5 above normal. Endorses that she does have cravings for potato chips.  - Kidney and liver function are normal.  - Sodium levels indicate that she is likely dehydrated. - Blood counts are stable and are not indicative of anemia.  - ALT levels are grossly within normal limits. No concerns.   Plan:  - Unless pre-existing renal or cardiopulmonary conditions exist which patient was told to limit their  fluid intake by another provider, I recommended roughly one half of their weight in pounds, to be the approximate ounces of non-caloric, non-caffeinated beverages they should drink per day; including more if they are engaging in exercise.  - I counseled patient on pathophysiology of the disease process of I.R. -  Educated patient that having protein with each meal is important for stabilizing sugars and an important part of her insulin resistance management as well as controlling hunger and cravings.  - Explained role of simple carbs and insulin levels on hunger and cravings - Handouts provided at pt's request after education provided.  All concerns/questions addressed.   Evagelia Lantagne will continue to work on weight loss, exercise, via their meal plan we devised to help decrease the risk of progressing to prediabetes.   - We will recheck A1c and fasting insulin level in approximately 3 months from last check, or as deemed appropriate.     Vitamin D deficiency  Assessment: Condition is new and not optimized. Labs were reviewed with pt today and education provided on them. All questions were answered. Lab Results  Component Value Date   VD25OH 20.6 (L) 03/11/2023  - Labs indicate that her Vitamin D levels are not within the recommended normal limits of 50-80.  - She is currently  not taking any OTC Vitamin D supplement.   Plan: - Begin Ergocalciferol 50K IU weekly  - I discussed the importance of vitamin D to the patient's health and well-being as well as to their ability to lose weight.  - I reviewed possible symptoms of low Vitamin D:  low energy, depressed mood, muscle aches, joint aches, osteoporosis etc. with patient - It has been show that administration of vitamin D supplementation leads to improved satiety and a decrease in inflammatory markers.  Hence, low Vitamin D levels may be linked to an increased risk of cardiovascular events and even increased risk of cancers- such as colon and  breast. - weight loss will likely improve availability of vitamin D, thus encouraged Kamrie to continue with meal plan and their weight loss efforts to further improve this condition.  Thus, we will need to monitor levels regularly (every 3-4 mo on average) to keep levels within normal limits and prevent over supplementation.    Vitamin B12 deficiency Assessment: Condition is new and not at goal. Labs were reviewed with patient today and education provided on them. We discussed how the foods patient eats may influence these laboratory findings.  All of the patient's questions about them were answered   Lab Results  Component Value Date   VITAMINB12 405 03/11/2023  -  Labs indicate that B12 levels are 405 , not at goal of over 500.  This diagnosis was reviewed with the patient and education was provided.   Plan: - Begin OTC Vitamin B12 500 mcg daily.  - Continue their prudent nutritional plan and focus on b12 rich foods such as lean red meats; poultry; eggs; seafood; beans, peas, and lentils; nuts and seeds; and soy products - We will continue to monitor as deemed clinically necessary. - Counseling provided today     Hyperlipidemia  Assessment: Condition is new and not optimized. Labs were reviewed with patient today and education provided on them. We discussed how the foods patient eats may influence these laboratory findings.  All of the patient's questions about them were answered   Lab Results  Component Value Date   CHOL 211 (H) 03/11/2023   HDL 80 03/11/2023   LDLCALC 113 (H) 03/11/2023   TRIG 106 03/11/2023   CHOLHDL 2.6 03/11/2023  Labs indicate that: - Her LDL and cholesterol levels are slightly elevated. - Her HDL levels are optimal and above 60.   Plan:  TISHEENA BRAMEL agrees to continue treatment plan of a heart-heathy, low cholesterol meal plan - I stressed the importance that patient continue with our prudent nutritional plan that is low in saturated and trans  fats, and low in fatty carbs to improve these numbers.  - We recommend: aerobic activity with eventual goal of a minimum of 150+ min wk plus 2 days/ week of resistance or strength training.   - We will continue routine screening as patient continues to achieve health goals along their weight loss journey      Other specified hypothyroidism Assessment: Condition is stable Labs were reviewed with pt today and education provided on them. All questions were answered about them.   Lab Results  Component Value Date   TSH 1.100 03/11/2023   FREET4 1.25 03/11/2023  - Labs indicate that her Thyroid function is stable.  - No issues with Synthroid 50 mcg daily. Denies any adverse effects.  Plan: - Continue with med as prescribed by pcp/specialist - Continue her prudent nutritional plan that is low in simple carbohydrates, saturated  fats and trans fats to goal of 5-10% weight loss to achieve significant health benefits.  - Will continue to monitor as deemed clinically necessary.    TREATMENT PLAN FOR OBESITY: Class 1 obesity with serious comorbidity and body mass index (BMI) of 30.0 to 30.9 in adult, unspecified obesity type Assessment:  SALAM ALPHONSO is here to discuss her progress with her obesity treatment plan along with follow-up of her obesity related diagnoses. See Medical Weight Management Flowsheet for complete bioelectrical impedance results.  Condition is improving, but not optimized. Biometric data collected today, was reviewed with patient.   Since last office visit patient's Fat mass has decreased by 1.2 lb. Muscle mass has decreased by 4.8 lb. Total body water has decreased by 3.8 lb.  Counseling done on how various foods will affect these numbers and how to maximize success  Total lbs lost to date: 7 Total weight loss percentage to date: 4.09   Patient endorses having cravings for potato chips.   Plan:  - Switched pt to Category 2 meal with lunch options.  - Strategized  with pt ways that she can follow meal plan on special occasions.    Behavioral Intervention Additional resources provided today: category 2 meal plan information Evidence-based interventions for health behavior change were utilized today including the discussion of self monitoring techniques, problem-solving barriers and SMART goal setting techniques.   Regarding patient's less desirable eating habits and patterns, we employed the technique of small changes.  Pt will specifically work on: continue prudent nutritional plan for next visit.    Recommended Physical Activity Goals  Joycee has been advised to slowly work up to 150 minutes of moderate intensity aerobic activity a week and strengthening exercises 2-3 times per week for cardiovascular health, weight loss maintenance and preservation of muscle mass.   She has agreed to Continue current level of physical activity    FOLLOW UP: Return in about 2 weeks (around 04/08/2023). She was informed of the importance of frequent follow up visits to maximize her success with intensive lifestyle modifications for her multiple health conditions.   Subjective:   Chief complaint: Obesity Anne-Marie is here to discuss her progress with her obesity treatment plan. She is on the the Category 1 Plan and states she is following her eating plan approximately 75 % of the time. She states she is exercising (water aerobics) 45 minutes 3 days per week.  Interval History:  ARTHA ZEMBA is here today for her first follow-up office visit since starting the program with [place].  Since last office visit: - She states that the meal plan was easy to follow and she was able to eat all the food.  - For snacks she eats [employer] at night.  - Had cravings for potatoes chips  - She has been measuring her lean protein intake.  - Had a difficult time following the meal plan on special occasion days. - She has an appointment with Dr.Barker on 03/31/23.    All  blood work/ lab tests that were recently ordered by myself or an outside provider were reviewed with patient today per their request. Extended time was spent counseling her on all new disease processes that were discovered or preexisting ones that are affected by BMI.  she understands that many of these abnormalities will need to monitored regularly along with the current treatment plan of prudent dietary changes, in which we are making each and every office visit, to improve these health parameters.   Review  of Systems:  Pertinent positives were addressed with patient today.   Weight Summary and Biometrics   Weight Lost Since Last Visit: 7lb  Weight Gained Since Last Visit: 0   Vitals Temp: 98.5 F (36.9 C) BP: 129/78 Pulse Rate: 73 SpO2: 98 %   Anthropometric Measurements Height: 5\' 3"  (1.6 m) Weight: 164 lb (74.4 kg) BMI (Calculated): 29.06 Weight at Last Visit: 171lb Weight Lost Since Last Visit: 7lb Weight Gained Since Last Visit: 0 Starting Weight: 170lb Total Weight Loss (lbs): 6 lb (2.722 kg)   Body Composition  Body Fat %: 38.4 % Fat Mass (lbs): 63.2 lbs Muscle Mass (lbs): 96.4 lbs Total Body Water (lbs): 69.2 lbs Visceral Fat Rating : 9   Other Clinical Data Fasting: No Labs: no Today's Visit #: 2 Starting Date: 03/11/23   Objective:   PHYSICAL EXAM:  Blood pressure 129/78, pulse 73, temperature 98.5 F (36.9 C), height 5\' 3"  (1.6 m), weight 164 lb (74.4 kg), last menstrual period 06/01/2016, SpO2 98 %. Body mass index is 29.05 kg/m.  General: Well Developed, well nourished, and in no acute distress.  HEENT: Normocephalic, atraumatic Skin: Warm and dry, cap RF less 2 sec, good turgor Chest:  Normal excursion, shape, no gross abn Respiratory: speaking in full sentences, no conversational dyspnea NeuroM-Sk: Ambulates w/o assistance, moves * 4 Psych: A and O *3, insight good, mood-full  DIAGNOSTIC DATA REVIEWED:  BMET    Component Value  Date/Time   NA 142 03/11/2023 1253   K 4.1 03/11/2023 1253   CL 102 03/11/2023 1253   CO2 23 03/11/2023 1253   GLUCOSE 77 03/11/2023 1253   GLUCOSE 92 09/13/2013 1955   BUN 13 03/11/2023 1253   CREATININE 0.77 03/11/2023 1253   CALCIUM 9.5 03/11/2023 1253   GFRNONAA >90 09/13/2013 1955   GFRAA >90 09/13/2013 1955   Lab Results  Component Value Date   HGBA1C 5.0 03/11/2023   Lab Results  Component Value Date   INSULIN 8.1 03/11/2023   Lab Results  Component Value Date   TSH 1.100 03/11/2023   CBC    Component Value Date/Time   WBC 4.8 03/11/2023 1253   WBC 12.6 (H) 06/20/2016 0515   RBC 4.68 03/11/2023 1253   RBC 3.81 (L) 06/20/2016 0515   HGB 14.7 03/11/2023 1253   HCT 44.0 03/11/2023 1253   PLT 320 03/11/2023 1253   MCV 94 03/11/2023 1253   MCH 31.4 03/11/2023 1253   MCH 31.8 06/20/2016 0515   MCHC 33.4 03/11/2023 1253   MCHC 34.1 06/20/2016 0515   RDW 12.6 03/11/2023 1253   Iron Studies No results found for: "IRON", "TIBC", "FERRITIN", "IRONPCTSAT" Lipid Panel     Component Value Date/Time   CHOL 211 (H) 03/11/2023 1253   TRIG 106 03/11/2023 1253   HDL 80 03/11/2023 1253   CHOLHDL 2.6 03/11/2023 1253   LDLCALC 113 (H) 03/11/2023 1253   Hepatic Function Panel     Component Value Date/Time   PROT 6.7 03/11/2023 1253   ALBUMIN 4.7 03/11/2023 1253   AST 25 03/11/2023 1253   ALT 33 (H) 03/11/2023 1253   ALKPHOS 143 (H) 03/11/2023 1253   BILITOT 0.5 03/11/2023 1253      Component Value Date/Time   TSH 1.100 03/11/2023 1253   Nutritional Lab Results  Component Value Date   VD25OH 20.6 (L) 03/11/2023    Attestations:   Reviewed by clinician on day of visit: allergies, medications, problem list, medical history, surgical history, family history, social  history, and previous encounter notes.   Patient was in the office today and time spent on visit including pre-visit chart review and post-visit care/coordination of care and electronic medical  record documentation was 50 minutes. 50% of the time was in face to face counseling of this patient's medical condition(s) and providing education on treatment options to include the first-line treatment of diet and lifestyle modification.   I,Special Puri,acting as a Neurosurgeon for [employer], DO.,have documented all relevant documentation on the behalf of Thomasene Lot, DO,as directed by  Thomasene Lot, DO while in the presence of Thomasene Lot, DO.   I, Thomasene Lot, DO, have reviewed all documentation for this visit. The documentation on 03/25/23 for the exam, diagnosis, procedures, and orders are all accurate and complete.

## 2023-03-31 ENCOUNTER — Telehealth (INDEPENDENT_AMBULATORY_CARE_PROVIDER_SITE_OTHER): Payer: BC Managed Care – PPO | Admitting: Psychology

## 2023-04-07 DIAGNOSIS — F4310 Post-traumatic stress disorder, unspecified: Secondary | ICD-10-CM | POA: Diagnosis not present

## 2023-04-08 ENCOUNTER — Ambulatory Visit (INDEPENDENT_AMBULATORY_CARE_PROVIDER_SITE_OTHER): Payer: BC Managed Care – PPO | Admitting: Family Medicine

## 2023-04-08 ENCOUNTER — Encounter (INDEPENDENT_AMBULATORY_CARE_PROVIDER_SITE_OTHER): Payer: Self-pay | Admitting: Family Medicine

## 2023-04-08 VITALS — BP 132/89 | HR 71 | Temp 98.4°F | Ht 63.0 in | Wt 163.0 lb

## 2023-04-08 DIAGNOSIS — E669 Obesity, unspecified: Secondary | ICD-10-CM

## 2023-04-08 DIAGNOSIS — E538 Deficiency of other specified B group vitamins: Secondary | ICD-10-CM | POA: Diagnosis not present

## 2023-04-08 DIAGNOSIS — E668 Other obesity: Secondary | ICD-10-CM

## 2023-04-08 DIAGNOSIS — K5909 Other constipation: Secondary | ICD-10-CM

## 2023-04-08 DIAGNOSIS — E559 Vitamin D deficiency, unspecified: Secondary | ICD-10-CM | POA: Diagnosis not present

## 2023-04-08 DIAGNOSIS — E88819 Insulin resistance, unspecified: Secondary | ICD-10-CM

## 2023-04-08 DIAGNOSIS — Z6828 Body mass index (BMI) 28.0-28.9, adult: Secondary | ICD-10-CM

## 2023-04-08 MED ORDER — VITAMIN D (ERGOCALCIFEROL) 1.25 MG (50000 UNIT) PO CAPS: 50000.0000 [IU] | ORAL_CAPSULE | ORAL | 0 refills | Status: DC

## 2023-04-08 NOTE — Progress Notes (Signed)
Carlye Grippe, D.O.  ABFM, ABOM Specializing in Clinical Bariatric Medicine  Office located at: [address] [place], [place]  [address]     Assessment and Plan:   Medications Discontinued During This Encounter  Medication Reason   Vitamin D, Ergocalciferol, (DRISDOL) 1.25 MG (50000 UNIT) CAPS capsule Reorder     Meds ordered this encounter  Medications   Vitamin D, Ergocalciferol, (DRISDOL) 1.25 MG (50000 UNIT) CAPS capsule    Sig: Take 1 capsule (50,000 Units total) by mouth every 7 (seven) days.    Dispense:  4 capsule    Refill:  0     Insulin resistance Assessment: Condition is diet controlled and improving, but not optimized. Lab Results  Component Value Date   HGBA1C 5.0 03/11/2023   INSULIN 8.1 03/11/2023  - Pt denies having any issues with hunger and cravings since I switched her to the Category 2 meal plan.   Plan:  - Alizon will continue to work on weight loss, exercise, via their meal plan we devised to help decrease the risk of progressing to diabetes.  - We will recheck A1c and fasting insulin level in approximately 3 months from last check, or as deemed appropriate.    Vitamin D deficiency Assessment: Condition is not optimized. Lab Results  Component Value Date   VD25OH 20.6 (L) 03/11/2023  - No issues with OTC Ergocalciferol 50K IU weekly. Denies any adverse effects.  Plan: - Continue with OTC supplement. Will reorder this today.   - Will continue to  monitor levels regularly (every 3-4 mo on average) to keep levels within normal limits and prevent over supplementation.   Vitamin B12 deficiency Assessment: Condition is not optimized. Lab Results  Component Value Date   VITAMINB12 405 03/11/2023  - She has been compliant and tolerant with OTC Vitamin B12 500 mcg daily. Denies any N/V/D.   Plan: - Continue with OTC supplement. - Will continue to monitor condition as it relates to her weight loss journey.     Other  constipation Assessment: Condition is not optimized. - Patient has a history of Constipation.  - Patient endorses that recently she has been going a couple of days without having a bowel movement.  - She has been adding Miralax to her coffee since her constipation symptoms restarted.  - She typically drinks 80-100 ounces of water daily.   Plan: - Patient advised to continue Miralax 1-2x daily.  - Good Bowel Health: Your goal is to have one soft bowel movement each day. Drink at least half of your weight in ounces of water per day unless otherwise noted by one of your doctors that you must restrict water intake.  Eat plenty of fiber (goal is over 25 grams each day).  It is best to get most of your fiber from dietary sources which includes leafy green vegetables, fresh fruit, and whole grains.   - Will Continue to monitor as deemed clinically necessary.   TREATMENT PLAN FOR OBESITY: Class 1 obesity with serious comorbidity and body mass index (BMI) of 30.0 to 30.9 in adult, unspecified obesity type Assessment:  DEBARAH MCKINZIE is here to discuss her progress with her obesity treatment plan along with follow-up of her obesity related diagnoses. See Medical Weight Management Flowsheet for complete bioelectrical impedance results.  Condition is improving. Biometric data collected today, was reviewed with patient.   Since last office visit on 03/25/23 patient's  Muscle mass has increased by 0.4 lb. Fat mass has decreased  by 1.6 lb. Total body water has increased by 1 lb.  Counseling done on how various foods will affect these numbers and how to maximize success  Total lbs lost to date: 8 Total weight loss percentage to date: 4.68   Plan:  - Continue with Category 2 meal plan with lunch options and only 100 snack calories.   - Encouraged pt to journal for increased mindfulness and awareness. Pt declined to journal for now, but may consider in the future.   Behavioral Intervention Additional  resources provided today: patient declined Evidence-based interventions for health behavior change were utilized today including the discussion of self monitoring techniques, problem-solving barriers and SMART goal setting techniques.   Regarding patient's less desirable eating habits and patterns, we employed the technique of small changes.  Pt will specifically work on: continue with mindful and intentional eating for next visit.    Recommended Physical Activity Goals Rhyannon has been advised to slowly work up to 150 minutes of moderate intensity aerobic activity a week and strengthening exercises 2-3 times per week for cardiovascular health, weight loss maintenance and preservation of muscle mass.   She has agreed to Continue current level of physical activity   FOLLOW UP: Return in about 20 days (around 04/28/2023). She was informed of the importance of frequent follow up visits to maximize her success with intensive lifestyle modifications for her multiple health conditions.   Subjective:   Chief complaint: Obesity Rashanna is here to discuss her progress with her obesity treatment plan. She is on the Category 2 meal plan with lunch options and only 100 snack calories and states she is following her eating plan approximately 75% of the time. She states she is walking 30 minutes 5 days per week.  Interval History:  MARGRETT AUDLEY is here for a follow up office visit.     Since last office visit:   - She has been making healthier choices in social occasions. - Pt also has been eating protein before going out to eat.  - Reports feeling exhausted in the evenings the past 5-6 nights. - Since we switched pt to Category 2 meal plan, she feels more satisfied and is having less cravings.  - She has an upcoming home sleep study.   Review of Systems:  Pertinent positives were addressed with patient today.  Weight Summary and Biometrics   Weight Lost Since Last Visit: 1  Weight Gained  Since Last Visit: 0    Vitals Temp: 98.4 F (36.9 C) BP: 132/89 Pulse Rate: 71 SpO2: 97 %   Anthropometric Measurements Height: 5\' 3"  (1.6 m) Weight: 163 lb (73.9 kg) BMI (Calculated): 28.88 Weight at Last Visit: 164 lb Weight Lost Since Last Visit: 1 Weight Gained Since Last Visit: 0 Starting Weight: 170 lb Total Weight Loss (lbs): 7 lb (3.175 kg) Peak Weight: 175 lb   Body Composition  Body Fat %: 37.7 % Fat Mass (lbs): 61.6 lbs Muscle Mass (lbs): 96.8 lbs Total Body Water (lbs): 70.2 lbs Visceral Fat Rating : 9   Other Clinical Data Fasting: No Labs: No Today's Visit #: 3 Starting Date: 03/11/23   Objective:   PHYSICAL EXAM: Blood pressure 132/89, pulse 71, temperature 98.4 F (36.9 C), height 5\' 3"  (1.6 m), weight 163 lb (73.9 kg), last menstrual period 06/01/2016, SpO2 97 %. Body mass index is 28.87 kg/m.  General: Well Developed, well nourished, and in no acute distress.  HEENT: Normocephalic, atraumatic Skin: Warm and dry, cap RF less 2 sec,  good turgor Chest:  Normal excursion, shape, no gross abn Respiratory: speaking in full sentences, no conversational dyspnea NeuroM-Sk: Ambulates w/o assistance, moves * 4 Psych: A and O *3, insight good, mood-full  DIAGNOSTIC DATA REVIEWED:  BMET    Component Value Date/Time   NA 142 03/11/2023 1253   K 4.1 03/11/2023 1253   CL 102 03/11/2023 1253   CO2 23 03/11/2023 1253   GLUCOSE 77 03/11/2023 1253   GLUCOSE 92 09/13/2013 1955   BUN 13 03/11/2023 1253   CREATININE 0.77 03/11/2023 1253   CALCIUM 9.5 03/11/2023 1253   GFRNONAA >90 09/13/2013 1955   GFRAA >90 09/13/2013 1955   Lab Results  Component Value Date   HGBA1C 5.0 03/11/2023   Lab Results  Component Value Date   INSULIN 8.1 03/11/2023   Lab Results  Component Value Date   TSH 1.100 03/11/2023   CBC    Component Value Date/Time   WBC 4.8 03/11/2023 1253   WBC 12.6 (H) 06/20/2016 0515   RBC 4.68 03/11/2023 1253   RBC 3.81 (L)  06/20/2016 0515   HGB 14.7 03/11/2023 1253   HCT 44.0 03/11/2023 1253   PLT 320 03/11/2023 1253   MCV 94 03/11/2023 1253   MCH 31.4 03/11/2023 1253   MCH 31.8 06/20/2016 0515   MCHC 33.4 03/11/2023 1253   MCHC 34.1 06/20/2016 0515   RDW 12.6 03/11/2023 1253   Iron Studies No results found for: "IRON", "TIBC", "FERRITIN", "IRONPCTSAT" Lipid Panel     Component Value Date/Time   CHOL 211 (H) 03/11/2023 1253   TRIG 106 03/11/2023 1253   HDL 80 03/11/2023 1253   CHOLHDL 2.6 03/11/2023 1253   LDLCALC 113 (H) 03/11/2023 1253   Hepatic Function Panel     Component Value Date/Time   PROT 6.7 03/11/2023 1253   ALBUMIN 4.7 03/11/2023 1253   AST 25 03/11/2023 1253   ALT 33 (H) 03/11/2023 1253   ALKPHOS 143 (H) 03/11/2023 1253   BILITOT 0.5 03/11/2023 1253      Component Value Date/Time   TSH 1.100 03/11/2023 1253   Nutritional Lab Results  Component Value Date   VD25OH 20.6 (L) 03/11/2023    Attestations:   Reviewed by clinician on day of visit: allergies, medications, problem list, medical history, surgical history, family history, social history, and previous encounter notes.   I,Special Puri,acting as a Neurosurgeon for [employer], DO.,have documented all relevant documentation on the behalf of Thomasene Lot, DO,as directed by  Thomasene Lot, DO while in the presence of Thomasene Lot, DO.   I, Thomasene Lot, DO, have reviewed all documentation for this visit. The documentation on 04/08/23 for the exam, diagnosis, procedures, and orders are all accurate and complete.

## 2023-04-20 DIAGNOSIS — G4733 Obstructive sleep apnea (adult) (pediatric): Secondary | ICD-10-CM | POA: Diagnosis not present

## 2023-04-28 ENCOUNTER — Telehealth (INDEPENDENT_AMBULATORY_CARE_PROVIDER_SITE_OTHER): Payer: BC Managed Care – PPO | Admitting: Psychology

## 2023-04-28 ENCOUNTER — Encounter (INDEPENDENT_AMBULATORY_CARE_PROVIDER_SITE_OTHER): Payer: Self-pay | Admitting: Family Medicine

## 2023-04-28 ENCOUNTER — Ambulatory Visit (INDEPENDENT_AMBULATORY_CARE_PROVIDER_SITE_OTHER): Payer: BC Managed Care – PPO | Admitting: Family Medicine

## 2023-04-28 VITALS — BP 137/82 | HR 76 | Temp 98.1°F | Ht 63.0 in | Wt 160.0 lb

## 2023-04-28 DIAGNOSIS — E559 Vitamin D deficiency, unspecified: Secondary | ICD-10-CM

## 2023-04-28 DIAGNOSIS — E668 Other obesity: Secondary | ICD-10-CM | POA: Diagnosis not present

## 2023-04-28 DIAGNOSIS — E88819 Insulin resistance, unspecified: Secondary | ICD-10-CM | POA: Diagnosis not present

## 2023-04-28 DIAGNOSIS — F39 Unspecified mood [affective] disorder: Secondary | ICD-10-CM

## 2023-04-28 DIAGNOSIS — Z6830 Body mass index (BMI) 30.0-30.9, adult: Secondary | ICD-10-CM

## 2023-04-28 DIAGNOSIS — E669 Obesity, unspecified: Secondary | ICD-10-CM

## 2023-04-28 DIAGNOSIS — Z6828 Body mass index (BMI) 28.0-28.9, adult: Secondary | ICD-10-CM

## 2023-04-28 MED ORDER — VITAMIN D (ERGOCALCIFEROL) 1.25 MG (50000 UNIT) PO CAPS: 50000.0000 [IU] | ORAL_CAPSULE | ORAL | 0 refills | Status: DC

## 2023-04-28 NOTE — Progress Notes (Signed)
Sonya Novak, D.O.  ABFM, ABOM Specializing in Clinical Bariatric Medicine  Office located at: [address] [place], [place]  [address]     Assessment and Plan:   Medications Discontinued During This Encounter  Medication Reason   Vitamin D, Ergocalciferol, (DRISDOL) 1.25 MG (50000 UNIT) CAPS capsule Reorder     Meds ordered this encounter  Medications   Vitamin D, Ergocalciferol, (DRISDOL) 1.25 MG (50000 UNIT) CAPS capsule    Sig: Take 1 capsule (50,000 Units total) by mouth every 7 (seven) days.    Dispense:  4 capsule    Refill:  0     Insulin resistance Assessment: Condition is not optimized and diet controlled. Pt is not taking any medication for this condition. She endorses that her hunger and cravings are controlled when following the prudent nutritional plan.   Lab Results  Component Value Date   HGBA1C 5.0 03/11/2023   INSULIN 8.1 03/11/2023    Plan: Continue her prudent nutritional plan that is low in simple carbohydrates, saturated fats and trans fats to goal of 5-10% weight loss to achieve significant health benefits.  Pt encouraged to continually advance exercise and cardiovascular fitness as tolerated throughout weight loss journey. Will continue to monitor condition alongside PCP.     Vitamin D deficiency Assessment: Condition is not optimized. Her Vitamin D def is being treated with Ergocalciferol 50K IU weekly and she is tolerating supplement well.   Lab Results  Component Value Date   VD25OH 20.6 (L) 03/11/2023   Plan: Continue with prescribed supplement. Will refill ERGO today.    Weight loss will likely improve availability of vitamin D, thus encouraged Jacinda to continue with meal plan and their weight loss efforts to further improve this condition.  Thus, we will need to monitor levels regularly (every 3-4 mo on average) to keep levels within normal limits and prevent over supplementation.    Mood disorder (HCC)- Emotional  Eating Assessment: Condition is stable. Denies any SI/HI. Mood is stable. Pt endorses not doing any emotional eating lately and that she has been working on intentional/mindful eating. She is compliant and tolerant with Wellbutrin XL 300 mg every morning.   Plan: Continue with mood medication as recommended by specialist.   Discussed how thoughts affect eating habits, modeling of thoughts, feelings, and behaviors, and strategies for change.  Importance of not skipping meals and getting all her proteins and fiber in on a daily basis discussed.    Reminded patient of the importance of following their prudent nutrition plan and how food can affect mood as well to support emotional wellbeing.  We will continue to monitor closely alongside PCP / other specialists.    TREATMENT PLAN FOR OBESITY: BMI 30.0-30.9,adult - current BMI 28.35 Class 1 obesity with serious comorbidity and body mass index (BMI) of 30.0 to 30.9 in adult, unspecified obesity type Assessment: Sonya Novak is here to discuss her progress with her obesity treatment plan along with follow-up of her obesity related diagnoses. See Medical Weight Management Flowsheet for complete bioelectrical impedance results.  Condition is improving, but not optimized.  Biometric data collected today, was reviewed with patient.   Since last office visit on 04/08/23 patient's  Muscle mass has decreased by 0.8 lb. Fat mass has decreased by 2 lb. Total body water has decreased by 1.6 lb.  Counseling done on how various foods will affect these numbers and how to maximize success  Total lbs lost to date: 11  Total weight  loss percentage to date: 6.43   Plan: Continue with Category 2 meal plan with lunch options and only 100 snack calories.   - I strongly encouraged pt to make healthy versions of the foods she loves.  Behavioral Intervention Additional resources provided today: Resistance Band Exercises Handout, Resistance Bands Evidence-based  interventions for health behavior change were utilized today including the discussion of self monitoring techniques, problem-solving barriers and SMART goal setting techniques.   Regarding patient's less desirable eating habits and patterns, we employed the technique of small changes.  Pt will specifically work on: walking 30 minutes, 3 days a wk and doing cardio classes at [facility] 2 days a wk for next visit.    Recommended Physical Activity Goals  Kenika has been advised to slowly work up to 150 minutes of moderate intensity aerobic activity a week and strengthening exercises 2-3 times per week for cardiovascular health, weight loss maintenance and preservation of muscle mass.   She has agreed to Think about ways to increase daily physical activity and overcoming barriers to exercise  FOLLOW UP: Return in about 20 days (around 05/18/2023). She was informed of the importance of frequent follow up visits to maximize her success with intensive lifestyle modifications for her multiple health conditions.  Subjective:   Chief complaint: Obesity [name] is here to discuss her progress with her obesity treatment plan. She is on the the Category 2 Plan with lunch options and only 100 snack calories and states she is following her eating plan approximately 76% of the time. She states she is not exercising.   Interval History:  Sonya Novak is here for a follow up office visit.  Since last OV, Aishani has been doing well. She recently returned from a beach vacation.  She meal prepped and packed all her breakfast and lunch. For dinner, she endorses having some deserts, but mostly focusing on her protein and veggie intake. During her vacation, she was also active. Overall, she is enjoying the meal plan and feels satisfied with it. Her mood has been stable and denies doing any emotional eating lately.   Pharmacotherapy for weight loss: She is currently taking Wellbutrin for medical weight loss.  Denies  side effects.    Review of Systems:  Pertinent positives were addressed with patient today.  Reviewed by clinician on day of visit: allergies, medications, problem list, medical history, surgical history, family history, social history, and previous encounter notes.  Weight Summary and Biometrics   Weight Lost Since Last Visit: 3lb  Weight Gained Since Last Visit: 0    Vitals Temp: 98.1 F (36.7 C) BP: 137/82 Pulse Rate: 76 SpO2: 95 %   Anthropometric Measurements Height: 5\' 3"  (1.6 m) Weight: 160 lb (72.6 kg) BMI (Calculated): 28.35 Weight at Last Visit: 163lb Weight Lost Since Last Visit: 3lb Weight Gained Since Last Visit: 0 Starting Weight: 170lb Total Weight Loss (lbs): 10 lb (4.536 kg) Peak Weight: 175lb   Body Composition  Body Fat %: 37.1 % Fat Mass (lbs): 59.6 lbs Muscle Mass (lbs): 96 lbs Total Body Water (lbs): 68.6 lbs Visceral Fat Rating : 8   Other Clinical Data Fasting: no Labs: no Today's Visit #: 4 Starting Date: 03/11/23   Objective:   PHYSICAL EXAM: Blood pressure 137/82, pulse 76, temperature 98.1 F (36.7 C), height 5\' 3"  (1.6 m), weight 160 lb (72.6 kg), last menstrual period 06/01/2016, SpO2 95 %. Body mass index is 28.34 kg/m.  General: Well Developed, well nourished, and in no acute distress.  HEENT: Normocephalic, atraumatic Skin: Warm and dry, cap RF less 2 sec, good turgor Chest:  Normal excursion, shape, no gross abn Respiratory: speaking in full sentences, no conversational dyspnea NeuroM-Sk: Ambulates w/o assistance, moves * 4 Psych: A and O *3, insight good, mood-full  DIAGNOSTIC DATA REVIEWED:  BMET    Component Value Date/Time   NA 142 03/11/2023 1253   K 4.1 03/11/2023 1253   CL 102 03/11/2023 1253   CO2 23 03/11/2023 1253   GLUCOSE 77 03/11/2023 1253   GLUCOSE 92 09/13/2013 1955   BUN 13 03/11/2023 1253   CREATININE 0.77 03/11/2023 1253   CALCIUM 9.5 03/11/2023 1253   GFRNONAA >90 09/13/2013 1955    GFRAA >90 09/13/2013 1955   Lab Results  Component Value Date   HGBA1C 5.0 03/11/2023   Lab Results  Component Value Date   INSULIN 8.1 03/11/2023   Lab Results  Component Value Date   TSH 1.100 03/11/2023   CBC    Component Value Date/Time   WBC 4.8 03/11/2023 1253   WBC 12.6 (H) 06/20/2016 0515   RBC 4.68 03/11/2023 1253   RBC 3.81 (L) 06/20/2016 0515   HGB 14.7 03/11/2023 1253   HCT 44.0 03/11/2023 1253   PLT 320 03/11/2023 1253   MCV 94 03/11/2023 1253   MCH 31.4 03/11/2023 1253   MCH 31.8 06/20/2016 0515   MCHC 33.4 03/11/2023 1253   MCHC 34.1 06/20/2016 0515   RDW 12.6 03/11/2023 1253   Iron Studies No results found for: "IRON", "TIBC", "FERRITIN", "IRONPCTSAT" Lipid Panel     Component Value Date/Time   CHOL 211 (H) 03/11/2023 1253   TRIG 106 03/11/2023 1253   HDL 80 03/11/2023 1253   CHOLHDL 2.6 03/11/2023 1253   LDLCALC 113 (H) 03/11/2023 1253   Hepatic Function Panel     Component Value Date/Time   PROT 6.7 03/11/2023 1253   ALBUMIN 4.7 03/11/2023 1253   AST 25 03/11/2023 1253   ALT 33 (H) 03/11/2023 1253   ALKPHOS 143 (H) 03/11/2023 1253   BILITOT 0.5 03/11/2023 1253      Component Value Date/Time   TSH 1.100 03/11/2023 1253   Nutritional Lab Results  Component Value Date   VD25OH 20.6 (L) 03/11/2023    Attestations:   I, Special Puri, acting as a Stage manager for [employer], DO., have compiled all relevant documentation for today's office visit on behalf of Thomasene Lot, DO, while in the presence of [employer], DO.  I have reviewed the above documentation for accuracy and completeness, and I agree with the above. Sonya Novak, D.O.  The 21st Century Cures Act was signed into law in 2016 which includes the topic of electronic health records.  This provides immediate access to information in MyChart.  This includes consultation notes, operative notes, office notes, lab results and pathology reports.  If you have any  questions about what you read please let us know at your next visit so we can discuss your concerns and take corrective action if need be.  We are right here with you.

## 2023-05-05 DIAGNOSIS — F4310 Post-traumatic stress disorder, unspecified: Secondary | ICD-10-CM | POA: Diagnosis not present

## 2023-05-13 DIAGNOSIS — R0683 Snoring: Secondary | ICD-10-CM | POA: Diagnosis not present

## 2023-05-14 DIAGNOSIS — F4310 Post-traumatic stress disorder, unspecified: Secondary | ICD-10-CM | POA: Diagnosis not present

## 2023-05-18 ENCOUNTER — Ambulatory Visit (INDEPENDENT_AMBULATORY_CARE_PROVIDER_SITE_OTHER): Payer: BC Managed Care – PPO | Admitting: Family Medicine

## 2023-05-18 ENCOUNTER — Encounter (INDEPENDENT_AMBULATORY_CARE_PROVIDER_SITE_OTHER): Payer: Self-pay | Admitting: Family Medicine

## 2023-05-18 VITALS — BP 121/79 | HR 81 | Temp 97.7°F | Ht 63.0 in | Wt 160.0 lb

## 2023-05-18 DIAGNOSIS — E668 Other obesity: Secondary | ICD-10-CM

## 2023-05-18 DIAGNOSIS — F39 Unspecified mood [affective] disorder: Secondary | ICD-10-CM

## 2023-05-18 DIAGNOSIS — Z6830 Body mass index (BMI) 30.0-30.9, adult: Secondary | ICD-10-CM

## 2023-05-18 DIAGNOSIS — E669 Obesity, unspecified: Secondary | ICD-10-CM

## 2023-05-18 DIAGNOSIS — E88819 Insulin resistance, unspecified: Secondary | ICD-10-CM | POA: Diagnosis not present

## 2023-05-18 DIAGNOSIS — E559 Vitamin D deficiency, unspecified: Secondary | ICD-10-CM

## 2023-05-18 DIAGNOSIS — Z6828 Body mass index (BMI) 28.0-28.9, adult: Secondary | ICD-10-CM

## 2023-05-18 MED ORDER — VITAMIN D (ERGOCALCIFEROL) 1.25 MG (50000 UNIT) PO CAPS: 50000.0000 [IU] | ORAL_CAPSULE | ORAL | 0 refills | Status: AC

## 2023-05-18 NOTE — Progress Notes (Signed)
Carlye Grippe, D.O.  ABFM, ABOM Specializing in Clinical Bariatric Medicine  Office located at: [address] [place], [place]  [address]     Assessment and Plan:   Medications Discontinued During This Encounter  Medication Reason   Vitamin D, Ergocalciferol, (DRISDOL) 1.25 MG (50000 UNIT) CAPS capsule Reorder     Meds ordered this encounter  Medications   Vitamin D, Ergocalciferol, (DRISDOL) 1.25 MG (50000 UNIT) CAPS capsule    Sig: Take 1 capsule (50,000 Units total) by mouth every 7 (seven) days.    Dispense:  4 capsule    Refill:  0     Vitamin D deficiency Assessment: Condition is Not at goal..Her vitamin D levels are very low at 20.6. She continues Ergocalciferol 50K IU weekly and tolerates it well.   Lab Results  Component Value Date   VD25OH 20.6 (L) 03/11/2023   Plan: Continue ERGO and will refill today. His weight loss will likely improve availability of vitamin D, thus encouraged Sonya Novak to continue with meal plan and their weight loss efforts to further improve this condition.  Thus, we will need to monitor levels regularly (every 3-4 mo on average) to keep levels within normal limits and prevent over supplementation.   Insulin resistance Not optimized. Goal is HgbA1c < 5.7, fasting insulin closer to 5.  This is diet/exercise controlled.  Lab Results  Component Value Date   HGBA1C 5.0 03/11/2023   Lab Results  Component Value Date   INSULIN 8.1 03/11/2023   Plan:  She will continue to focus on protein-rich, low simple carbohydrate foods. We reviewed the importance of hydration, regular exercise for stress reduction, and restorative sleep. Continue to decrease simple carbs/ sugars; increase fiber and proteins -> follow her meal plan. Anticipatory guidance given.  We will recheck A1c and fasting insulin level in approximately 3 months from last check, or as deemed appropriate.    Mood disorder (HCC)- Emotional Eating Assessment: Condition is  Controlled.. Denies any SI/HI. Mood is stable. Cravings and hunger are well controlled. She continues to be consistent with take Wellbutrin 300mg  daily with and is tolerating it well.   Plan: I reminded patient of the importance of following their prudent nutrition plan and how food can affect mood as well to support emotional wellbeing.  We will continue to monitor closely alongside PCP / other specialists.   TREATMENT PLAN FOR OBESITY: BMI 30.0-30.9,adult - current BMI 28.35 Class 1 obesity with serious comorbidity and body mass index (BMI) of 30.0 to 30.9 in adult, unspecified obesity type Assessment:  Sonya Novak is here to discuss her progress with her obesity treatment plan along with follow-up of her obesity related diagnoses. See Medical Weight Management Flowsheet for complete bioelectrical impedance results.  Condition is improving but not optimized. Biometric data collected today, was reviewed with patient.   Since last office visit on 04/28/2023 patient's  Muscle mass has increased by 0.8lb. Fat mass has decreased by 1.2lb. Total body water has increased by 1.6lb.  Counseling done on how various foods will affect these numbers and how to maximize success  Total lbs lost to date: 10lbs Total weight loss percentage to date: 5.88%  Plan:  Continue to folow the Category 2 meal plan with lunch options and only 100 snack calories.    Behavioral Intervention Additional resources provided today: Recipes and breakfast options Evidence-based interventions for health behavior change were utilized today including the discussion of self monitoring techniques, problem-solving barriers and SMART goal setting  techniques.   Regarding patient's less desirable eating habits and patterns, we employed the technique of small changes.  Pt will specifically work on: tracking 100 calorie snack and journaling for next visit.  ***  Recommended Physical Activity Goals  Sonya Novak has been advised  to slowly work up to 150 minutes of moderate intensity aerobic activity a week and strengthening exercises 2-3 times per week for cardiovascular health, weight loss maintenance and preservation of muscle mass.   She has agreed to Continue current level of physical activity   FOLLOW UP: Return in about 3 weeks (around 06/08/2023). She was informed of the importance of frequent follow up visits to maximize her success with intensive lifestyle modifications for her multiple health conditions.  Subjective:   Chief complaint: Obesity Sonya Novak is here to discuss her progress with her obesity treatment plan. She is on the the Category 2 Plan and states she is following her eating plan approximately 60% of the time. She states she is exercising by cardio 60-45 minutes 5 days per week.  Interval History:  Sonya Novak is here for a follow up office visit.     Since last OV she states that she has been feeling off lately. She informed me that she has "slacked" on her nutritional plan. She continues to substitute regular snacks with healthier alternatives such as quest chips. We reviewed her meal plan and all questions were answered. Patient's food recall appears to be accurate and consistent with what is on plan when she is following it. When eating on plan, her hunger and cravings are well controlled.      Pharmacotherapy for weight loss: She is currently taking Wellbutrin for medical weight loss.  Denies side effects.    Review of Systems:  Pertinent positives were addressed with patient today.   Reviewed by clinician on day of visit: allergies, medications, problem list, medical history, surgical history, family history, social history, and previous encounter notes.  Weight Summary and Biometrics   Weight Lost Since Last Visit: 0lb  Weight Gained Since Last Visit: 0lb  Vitals Temp: 97.7 F (36.5 C) BP: 121/79 Pulse Rate: 81 SpO2: 97 %   Anthropometric Measurements Height: 5\' 3"   (1.6 m) Weight: 160 lb (72.6 kg) BMI (Calculated): 28.35 Weight at Last Visit: 160lb Weight Lost Since Last Visit: 0lb Weight Gained Since Last Visit: 0lb Starting Weight: 170lb Total Weight Loss (lbs): 10 lb (4.536 kg) Peak Weight: 175lb   Body Composition  Body Fat %: 36.4 % Fat Mass (lbs): 58.4 lbs Muscle Mass (lbs): 96.8 lbs Total Body Water (lbs): 70.2 lbs Visceral Fat Rating : 8   Other Clinical Data Fasting: no Labs: no Today's Visit #: 5 Starting Date: 03/11/23   Objective:   PHYSICAL EXAM: Blood pressure 121/79, pulse 81, temperature 97.7 F (36.5 C), height 5\' 3"  (1.6 m), weight 160 lb (72.6 kg), last menstrual period 06/01/2016, SpO2 97%. Body mass index is 28.34 kg/m.  General: Well Developed, well nourished, and in no acute distress.  HEENT: Normocephalic, atraumatic Skin: Warm and dry, cap RF less 2 sec, good turgor Chest:  Normal excursion, shape, no gross abn Respiratory: speaking in full sentences, no conversational dyspnea NeuroM-Sk: Ambulates w/o assistance, moves * 4 Psych: A and O *3, insight good, mood-full  DIAGNOSTIC DATA REVIEWED:  BMET    Component Value Date/Time   NA 142 03/11/2023 1253   K 4.1 03/11/2023 1253   CL 102 03/11/2023 1253   CO2 23 03/11/2023 1253   GLUCOSE  77 03/11/2023 1253   GLUCOSE 92 09/13/2013 1955   BUN 13 03/11/2023 1253   CREATININE 0.77 03/11/2023 1253   CALCIUM 9.5 03/11/2023 1253   GFRNONAA >90 09/13/2013 1955   GFRAA >90 09/13/2013 1955   Lab Results  Component Value Date   HGBA1C 5.0 03/11/2023   Lab Results  Component Value Date   INSULIN 8.1 03/11/2023   Lab Results  Component Value Date   TSH 1.100 03/11/2023   CBC    Component Value Date/Time   WBC 4.8 03/11/2023 1253   WBC 12.6 (H) 06/20/2016 0515   RBC 4.68 03/11/2023 1253   RBC 3.81 (L) 06/20/2016 0515   HGB 14.7 03/11/2023 1253   HCT 44.0 03/11/2023 1253   PLT 320 03/11/2023 1253   MCV 94 03/11/2023 1253   MCH 31.4  03/11/2023 1253   MCH 31.8 06/20/2016 0515   MCHC 33.4 03/11/2023 1253   MCHC 34.1 06/20/2016 0515   RDW 12.6 03/11/2023 1253   Iron Studies No results found for: "IRON", "TIBC", "FERRITIN", "IRONPCTSAT" Lipid Panel     Component Value Date/Time   CHOL 211 (H) 03/11/2023 1253   TRIG 106 03/11/2023 1253   HDL 80 03/11/2023 1253   CHOLHDL 2.6 03/11/2023 1253   LDLCALC 113 (H) 03/11/2023 1253   Hepatic Function Panel     Component Value Date/Time   PROT 6.7 03/11/2023 1253   ALBUMIN 4.7 03/11/2023 1253   AST 25 03/11/2023 1253   ALT 33 (H) 03/11/2023 1253   ALKPHOS 143 (H) 03/11/2023 1253   BILITOT 0.5 03/11/2023 1253      Component Value Date/Time   TSH 1.100 03/11/2023 1253   Nutritional Lab Results  Component Value Date   VD25OH 20.6 (L) 03/11/2023    Attestations:   This encounter took 40 total minutes of time including any pre-visit and post-visit time spent on this date of service, including taking a thorough history, reviewing any labs and/or imaging, reviewing prior notes, as well as documenting in the electronic health record on the date of service. Over 50% of that time was in direct face-to-face counseling and coordinating care for the patient today  I, Clinical biochemist, acting as a Stage manager for [employer], DO., have compiled all relevant documentation for today's office visit on behalf of Thomasene Lot, DO, while in the presence of [employer], DO.  I have reviewed the above documentation for accuracy and completeness, and I agree with the above. Carlye Grippe, D.O.  The 21st Century Cures Act was signed into law in 2016 which includes the topic of electronic health records.  This provides immediate access to information in MyChart.  This includes consultation notes, operative notes, office notes, lab results and pathology reports.  If you have any questions about what you read please let us know at your next visit so we can discuss your  concerns and take corrective action if need be.  We are right here with you.

## 2023-05-21 DIAGNOSIS — F4310 Post-traumatic stress disorder, unspecified: Secondary | ICD-10-CM | POA: Diagnosis not present

## 2023-06-02 DIAGNOSIS — F4310 Post-traumatic stress disorder, unspecified: Secondary | ICD-10-CM | POA: Diagnosis not present

## 2023-06-03 ENCOUNTER — Ambulatory Visit: Payer: BC Managed Care – PPO | Admitting: Obstetrics & Gynecology

## 2023-06-08 ENCOUNTER — Ambulatory Visit (INDEPENDENT_AMBULATORY_CARE_PROVIDER_SITE_OTHER): Payer: BC Managed Care – PPO | Admitting: Family Medicine

## 2023-06-24 DIAGNOSIS — F4310 Post-traumatic stress disorder, unspecified: Secondary | ICD-10-CM | POA: Diagnosis not present

## 2023-06-25 ENCOUNTER — Other Ambulatory Visit (INDEPENDENT_AMBULATORY_CARE_PROVIDER_SITE_OTHER): Payer: Self-pay | Admitting: Family Medicine

## 2023-06-25 DIAGNOSIS — E559 Vitamin D deficiency, unspecified: Secondary | ICD-10-CM

## 2023-06-30 DIAGNOSIS — F4310 Post-traumatic stress disorder, unspecified: Secondary | ICD-10-CM | POA: Diagnosis not present

## 2023-07-13 DIAGNOSIS — H10023 Other mucopurulent conjunctivitis, bilateral: Secondary | ICD-10-CM | POA: Diagnosis not present

## 2023-07-14 ENCOUNTER — Encounter: Payer: Self-pay | Admitting: Radiology

## 2023-07-14 ENCOUNTER — Ambulatory Visit (INDEPENDENT_AMBULATORY_CARE_PROVIDER_SITE_OTHER): Payer: BC Managed Care – PPO | Admitting: Radiology

## 2023-07-14 VITALS — BP 144/94 | HR 78 | Resp 12 | Ht 62.75 in | Wt 161.0 lb

## 2023-07-14 DIAGNOSIS — N958 Other specified menopausal and perimenopausal disorders: Secondary | ICD-10-CM

## 2023-07-14 DIAGNOSIS — Z01419 Encounter for gynecological examination (general) (routine) without abnormal findings: Secondary | ICD-10-CM | POA: Diagnosis not present

## 2023-07-14 MED ORDER — IMVEXXY MAINTENANCE PACK 10 MCG VA INST: 1.0000 | VAGINAL_INSERT | VAGINAL | 11 refills | Status: DC

## 2023-07-14 NOTE — Progress Notes (Signed)
   Sonya Novak 06/08/1971 [id number]   History:  52 y.o. G1P0 presents for annual exam. C/o vaginal dryness and dyspareunia. Found estrace cream to be very messy. No other gyn concerns. Working with healthy weight and wellness for weight loss. Has a PCP.   Gynecologic History Hysterectomy: fibroids, supracervical  Sexually active: occasionally  Health Maintenance Last Pap: 01/2021 normal, 2020 ASCUS Last mammogram: 11/20/22 Last colonoscopy: 2022 Dr Loreta [address] Past medical history, past surgical history, family history and social history were all reviewed and documented in the EPIC chart.  ROS:  A ROS was performed and pertinent positives and negatives are included.  Exam:  Vitals:   07/14/23 1601 07/14/23 1602  BP: (!) 140/92 (!) 144/94  Pulse:  78  Resp:  12  Weight:  161 lb (73 kg)  Height:  5' 2.75" (1.594 m)   Body mass index is 28.75 kg/m.  General appearance:  Normal Thyroid:  Symmetrical, normal in size, without palpable masses or nodularity. Respiratory  Auscultation:  Clear without wheezing or rhonchi Cardiovascular  Auscultation:  Regular rate, without rubs, murmurs or gallops  Edema/varicosities:  Not grossly evident Abdominal  Soft,nontender, without masses, guarding or rebound.  Liver/spleen:  No organomegaly noted  Hernia:  None appreciated  Skin  Inspection:  Grossly normal Breasts: Examined lying and sitting.   Right: Without masses, retractions, nipple discharge or axillary adenopathy.   Left: Without masses, retractions, nipple discharge or axillary adenopathy. Genitourinary   Inguinal/mons:  Normal without inguinal adenopathy  External genitalia:  Normal appearing vulva with no masses, tenderness, or lesions  BUS/Urethra/Skene's glands:  Normal  Vagina:  Normal appearing with normal color and discharge, no lesions. Atrophy moderate  Cervix: no lesions or discharge  Uterus:  absent  Adnexa/parametria:     Rt: Normal in size, without masses or  tenderness.   Lt: Normal in size, without masses or tenderness.  Anus and perineum: Normal   Arnold Long, CMA present for exam  Assessment/Plan:   1. Well woman exam with routine gynecological exam Pap due 2025  2. Genitourinary syndrome of menopause - Estradiol (IMVEXXY MAINTENANCE PACK) 10 MCG INST; Place 1 tablet vaginally 2 (two) times a week.  Dispense: 8 each; Refill: 11     Discussed SBE, colonoscopy and DEXA screening as appropriate. Encouraged 126mins/week of cardiovascular and weight bearing exercise minimum. Recommend the use of seatbelts and sunscreen consistently.   Return in 1 year for annual or sooner prn.  Arlie Solomons B WHNP-BC 4:19 PM 07/14/2023

## 2023-07-28 DIAGNOSIS — F4310 Post-traumatic stress disorder, unspecified: Secondary | ICD-10-CM | POA: Diagnosis not present

## 2023-08-04 ENCOUNTER — Other Ambulatory Visit (INDEPENDENT_AMBULATORY_CARE_PROVIDER_SITE_OTHER): Payer: Self-pay | Admitting: Family Medicine

## 2023-08-04 DIAGNOSIS — E559 Vitamin D deficiency, unspecified: Secondary | ICD-10-CM

## 2023-08-25 DIAGNOSIS — F4310 Post-traumatic stress disorder, unspecified: Secondary | ICD-10-CM | POA: Diagnosis not present

## 2023-09-02 DIAGNOSIS — Z23 Encounter for immunization: Secondary | ICD-10-CM | POA: Diagnosis not present

## 2023-09-02 DIAGNOSIS — I10 Essential (primary) hypertension: Secondary | ICD-10-CM | POA: Diagnosis not present

## 2023-09-02 DIAGNOSIS — Z0000 Encounter for general adult medical examination without abnormal findings: Secondary | ICD-10-CM | POA: Diagnosis not present

## 2023-09-02 DIAGNOSIS — E039 Hypothyroidism, unspecified: Secondary | ICD-10-CM | POA: Diagnosis not present

## 2023-09-02 DIAGNOSIS — N951 Menopausal and female climacteric states: Secondary | ICD-10-CM | POA: Diagnosis not present

## 2023-09-02 DIAGNOSIS — Z13220 Encounter for screening for lipoid disorders: Secondary | ICD-10-CM | POA: Diagnosis not present

## 2023-09-10 IMAGING — MG MM DIGITAL SCREENING BILAT W/ TOMO AND CAD
8 series · 8 of 24 positions shown · non-contrast
Comparison: Previous exam(s).

CLINICAL DATA: Screening.

EXAM:
DIGITAL SCREENING BILATERAL MAMMOGRAM WITH TOMOSYNTHESIS AND CAD
TECHNIQUE: Bilateral screening digital craniocaudal and mediolateral oblique
mammograms were obtained. Bilateral screening digital breast
tomosynthesis was performed. The images were evaluated with
computer-aided detection.

[L CC synth-2D]
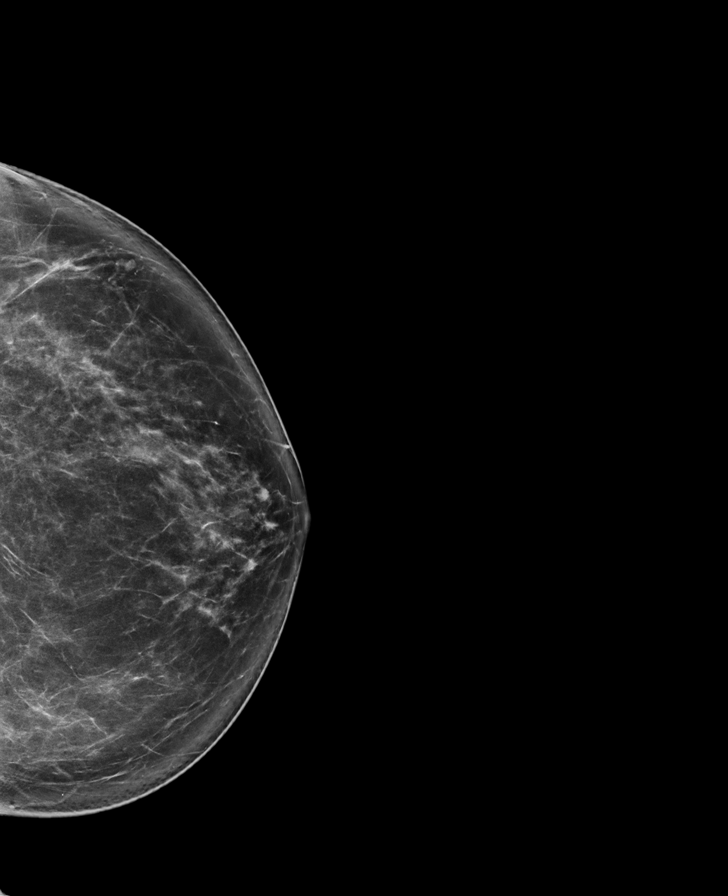

[R MLO synth-2D]
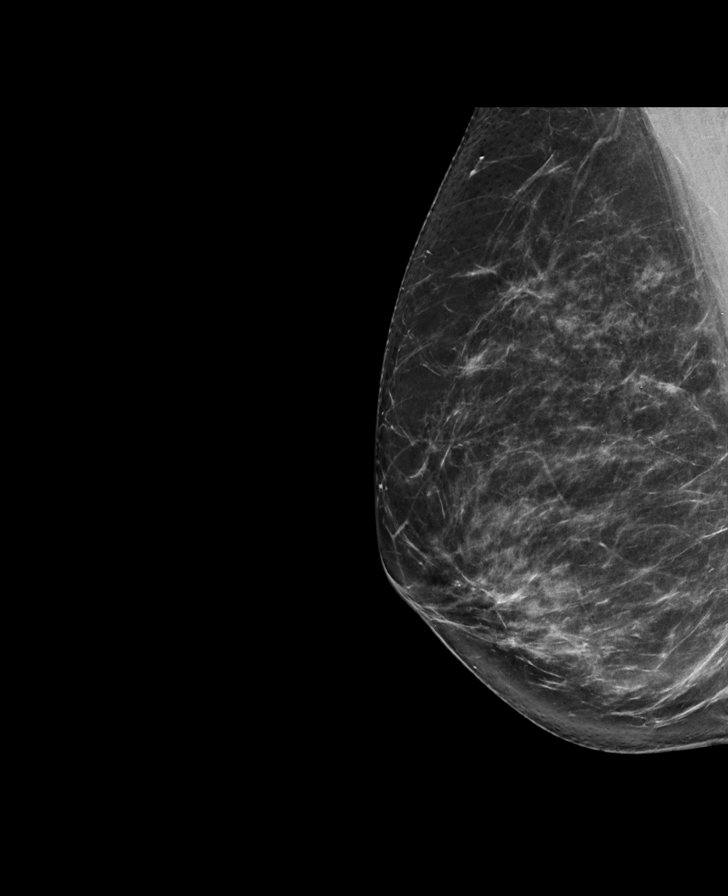

[L MLO synth-2D]
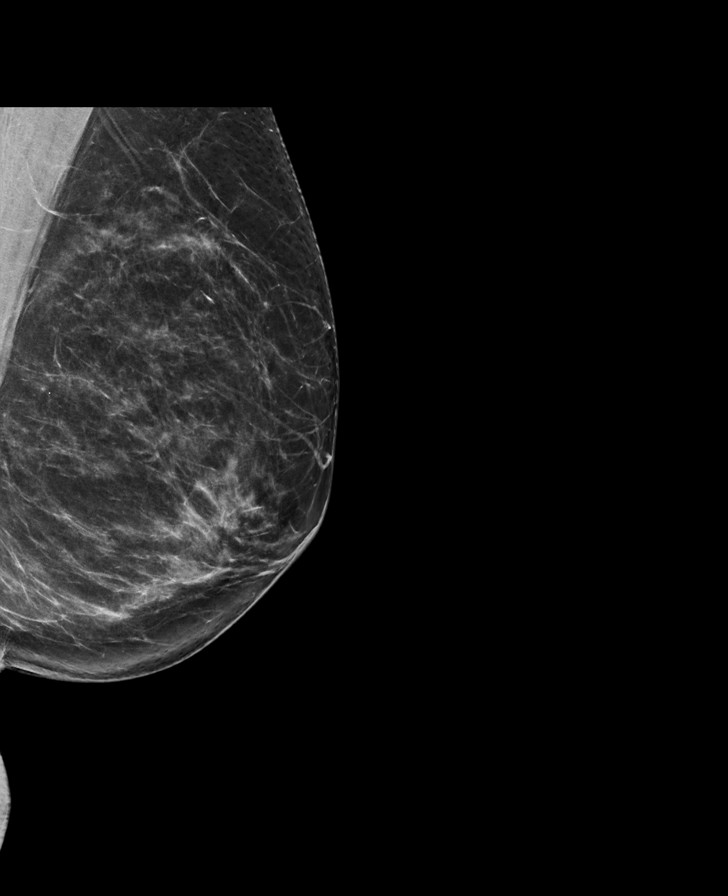

[R CC synth-2D]
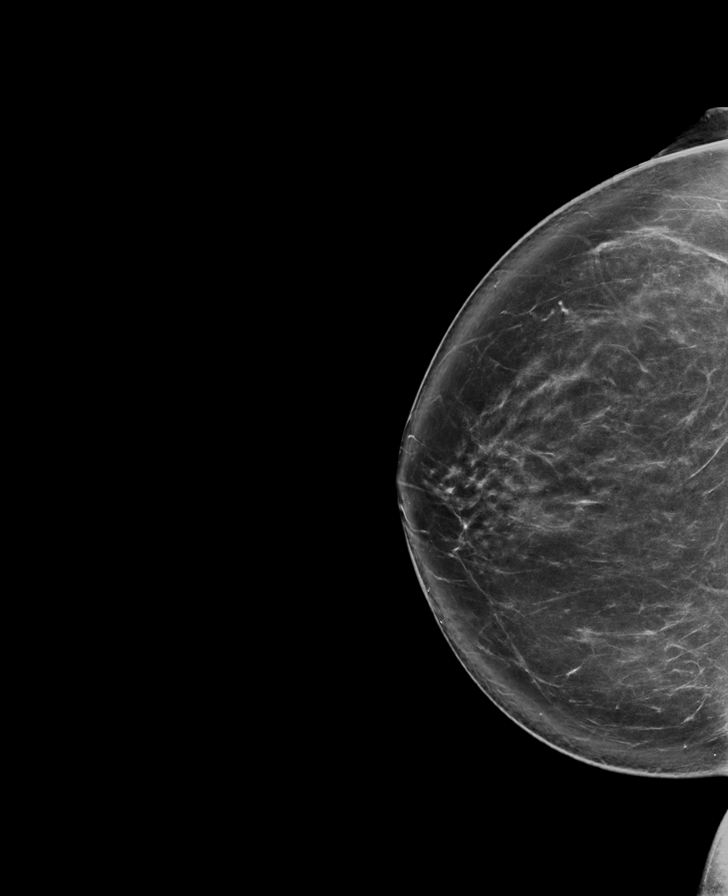

[R MLO tomo · tomo slice 42/83.0]
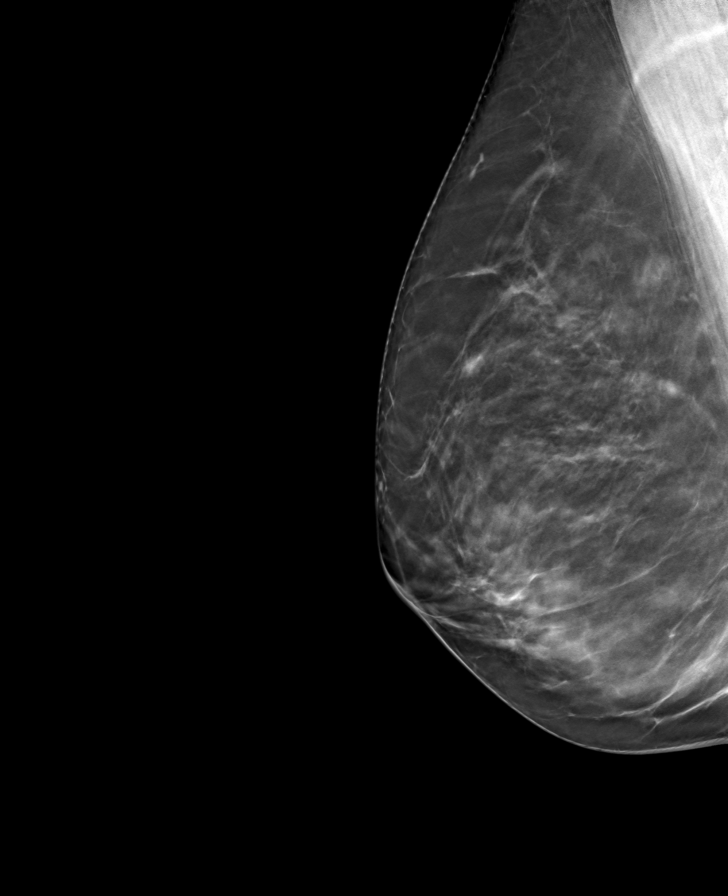

[L MLO tomo · tomo slice 41/82.0]
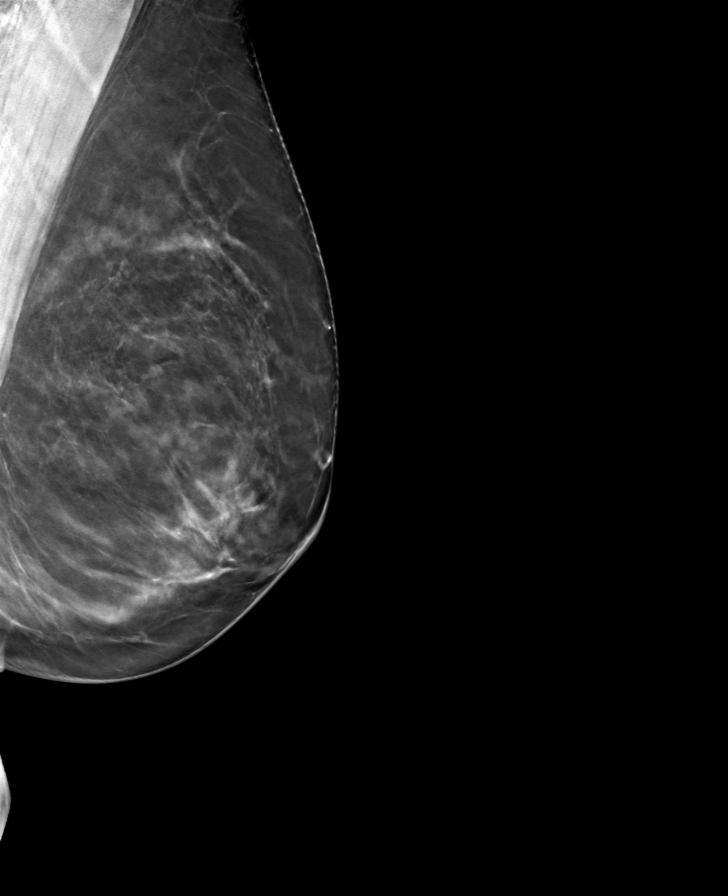

[L CC tomo · tomo slice 44/87.0]
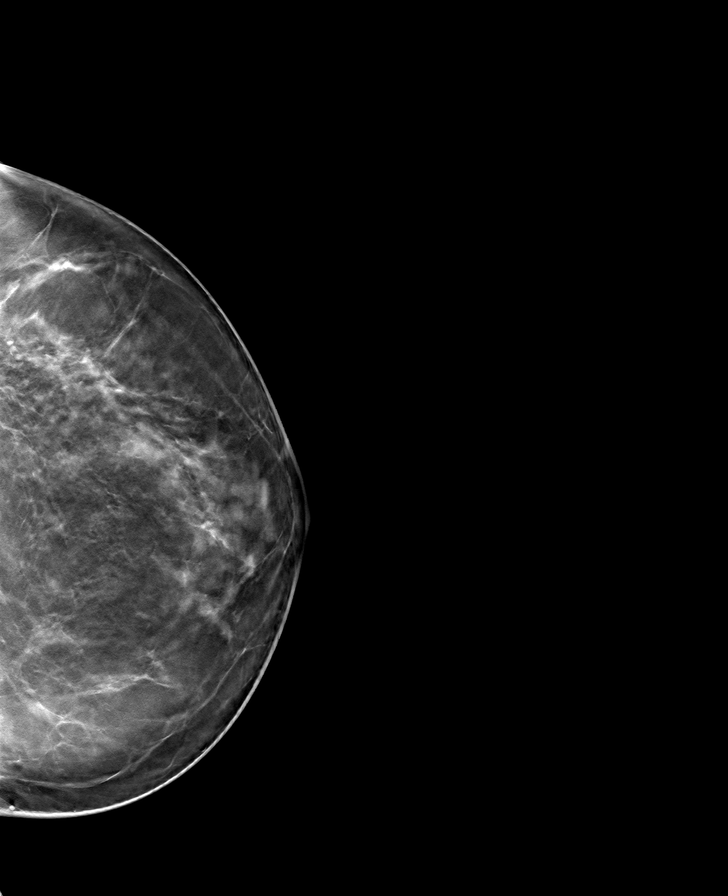

[R CC tomo · tomo slice 51/102.0]
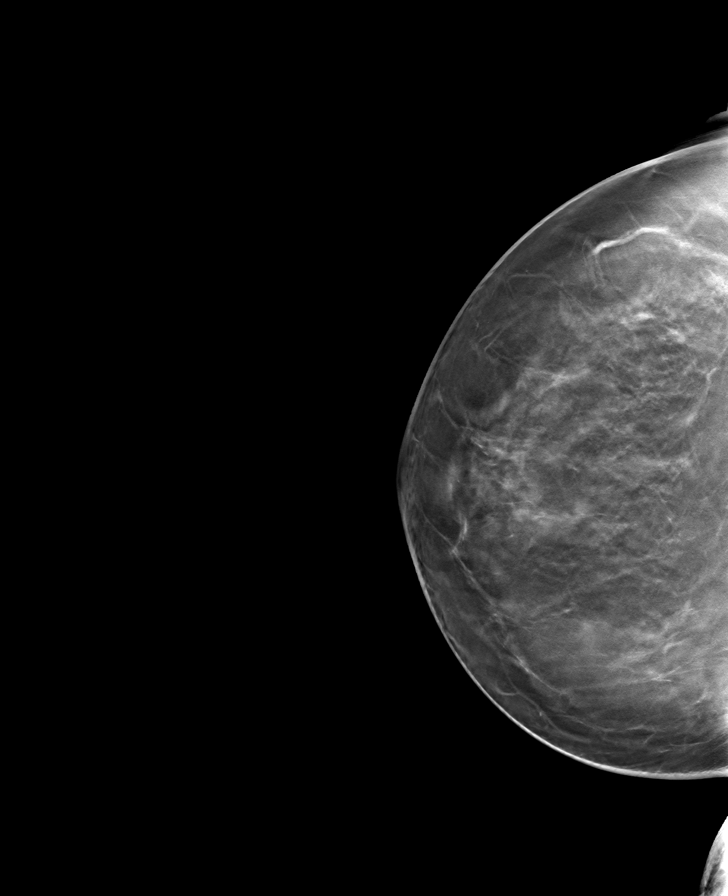

[8 of 24 positions shown; findings below may reference images not displayed]

ACR Breast Density Category b: There are scattered areas of
fibroglandular density.
FINDINGS: There are no findings suspicious for malignancy.
IMPRESSION: No mammographic evidence of malignancy. A result letter of this
screening mammogram will be mailed directly to the patient.

RECOMMENDATION:
Screening mammogram in one year. (Code:51-O-LD2)

BI-RADS CATEGORY  1: Negative.

## 2023-09-22 DIAGNOSIS — F4310 Post-traumatic stress disorder, unspecified: Secondary | ICD-10-CM | POA: Diagnosis not present

## 2023-10-20 DIAGNOSIS — F4310 Post-traumatic stress disorder, unspecified: Secondary | ICD-10-CM | POA: Diagnosis not present

## 2023-10-29 ENCOUNTER — Other Ambulatory Visit: Payer: Self-pay | Admitting: Family Medicine

## 2023-10-29 DIAGNOSIS — Z1231 Encounter for screening mammogram for malignant neoplasm of breast: Secondary | ICD-10-CM

## 2023-11-24 ENCOUNTER — Ambulatory Visit
Admission: RE | Admit: 2023-11-24 | Discharge: 2023-11-24 | Disposition: A | Discharge status: Home / Self Care | Payer: 59 | Source: Ambulatory Visit | Attending: Family Medicine | Admitting: Family Medicine

## 2023-11-24 DIAGNOSIS — Z1231 Encounter for screening mammogram for malignant neoplasm of breast: Secondary | ICD-10-CM

## 2024-02-10 ENCOUNTER — Other Ambulatory Visit (HOSPITAL_COMMUNITY): Payer: Self-pay | Admitting: Family Medicine

## 2024-02-10 DIAGNOSIS — R0789 Other chest pain: Secondary | ICD-10-CM

## 2024-02-22 ENCOUNTER — Ambulatory Visit (HOSPITAL_COMMUNITY)
Admission: RE | Admit: 2024-02-22 | Discharge: 2024-02-22 | Disposition: A | Discharge status: Home / Self Care | Payer: Self-pay | Source: Ambulatory Visit | Attending: Family Medicine | Admitting: Family Medicine

## 2024-02-22 DIAGNOSIS — R0789 Other chest pain: Secondary | ICD-10-CM | POA: Insufficient documentation

## 2024-05-31 ENCOUNTER — Ambulatory Visit: Admitting: Family Medicine

## 2024-07-15 ENCOUNTER — Ambulatory Visit: Payer: BC Managed Care – PPO | Admitting: Radiology

## 2024-07-21 ENCOUNTER — Encounter: Payer: Self-pay | Admitting: Radiology

## 2024-07-21 ENCOUNTER — Ambulatory Visit (INDEPENDENT_AMBULATORY_CARE_PROVIDER_SITE_OTHER): Admitting: Radiology

## 2024-07-21 ENCOUNTER — Other Ambulatory Visit (HOSPITAL_COMMUNITY)
Admission: RE | Admit: 2024-07-21 | Discharge: 2024-07-21 | Disposition: A | Discharge status: Home / Self Care | Source: Ambulatory Visit | Attending: Radiology | Admitting: Radiology

## 2024-07-21 VITALS — BP 126/84 | HR 76 | Ht 63.5 in | Wt 162.0 lb

## 2024-07-21 DIAGNOSIS — Z1331 Encounter for screening for depression: Secondary | ICD-10-CM | POA: Diagnosis not present

## 2024-07-21 DIAGNOSIS — Z01419 Encounter for gynecological examination (general) (routine) without abnormal findings: Secondary | ICD-10-CM | POA: Diagnosis present

## 2024-07-21 DIAGNOSIS — N958 Other specified menopausal and perimenopausal disorders: Secondary | ICD-10-CM | POA: Diagnosis not present

## 2024-07-21 MED ORDER — IMVEXXY MAINTENANCE PACK 10 MCG VA INST
1.0000 | VAGINAL_INSERT | VAGINAL | 4 refills | Status: AC
Start: 2024-07-21 — End: ?

## 2024-07-21 NOTE — Progress Notes (Signed)
   Sonya Novak 03-Apr-1971 [id number]   History: Postmenopausal 53 y.o. presents for annual exam. No new gyn concerns. Needs refills on imvexxy . Getting over a 4 week respiratory virus.    Gynecologic History Postmenopausal Last Pap: 2022. Results were: normal Last mammogram: 1/25. Results were: normal Last colonoscopy: 2022   Obstetric History OB History  Gravida Para Term Preterm AB Living  1 0   1 0  SAB IAB Ectopic Multiple Live Births   1       # Outcome Date GA Lbr Len/2nd Weight Sex Type Anes PTL Lv  1 IAB                07/21/2024    3:57 PM  Depression screen PHQ 2/9  Decreased Interest 0  Down, Depressed, Hopeless 0  PHQ - 2 Score 0     The following portions of the patient's history were reviewed and updated as appropriate: allergies, current medications, past family history, past medical history, past social history, past surgical history, and problem list.  Review of Systems Pertinent items noted in HPI and remainder of comprehensive ROS otherwise negative.  Past medical history, past surgical history, family history and social history were all reviewed and documented in the EPIC chart.  Exam:  Vitals:   07/21/24 1556  BP: 126/84  Pulse: 76  SpO2: 99%  Weight: 162 lb (73.5 kg)  Height: 5' 3.5 (1.613 m)   Body mass index is 28.25 kg/m.  General appearance:  Normal Thyroid :  Symmetrical, normal in size, without palpable masses or nodularity. Respiratory  Auscultation:  Clear without wheezing or rhonchi on right. Left upper airway +inspiratory wheeze. Clear lung bases. Cardiovascular  Auscultation:  Regular rate, without rubs, murmurs or gallops  Edema/varicosities:  Not grossly evident Abdominal  Soft,nontender, without masses, guarding or rebound.  Liver/spleen:  No organomegaly noted  Hernia:  None appreciated  Skin  Inspection:  Grossly normal Breasts: Examined lying and sitting.   Right: Without masses, retractions, nipple discharge  or axillary adenopathy.   Left: Without masses, retractions, nipple discharge or axillary adenopathy. Genitourinary   Inguinal/mons:  Normal without inguinal adenopathy  External genitalia:  Normal appearing vulva with no masses, tenderness, or lesions  BUS/Urethra/Skene's glands:  Normal  Vagina:  Normal appearing with normal color and discharge, no lesions. Atrophy: mild   Cervix:  Normal appearing without discharge or lesions  Uterus:  Normal in size, shape and contour.  Midline and mobile, nontender  Adnexa/parametria:     Rt: Normal in size, without masses or tenderness.   Lt: Normal in size, without masses or tenderness.  Anus and perineum: Normal    Sonya Novak, CMA present for exam  Assessment/Plan:   1. Well woman exam with routine gynecological exam (Primary) - Cytology - PAP( [place])  2. Genitourinary syndrome of menopause - Estradiol  (IMVEXXY  MAINTENANCE PACK) 10 MCG INST; Place 1 tablet vaginally 2 (two) times a week.  Dispense: 24 each; Refill: 4  3. Depression screen   Return in 1 year for annual or sooner prn.  Sonya Novak B WHNP-BC, 4:11 PM 07/21/2024

## 2024-07-25 ENCOUNTER — Ambulatory Visit: Payer: Self-pay | Admitting: Radiology

## 2024-07-25 LAB — CYTOLOGY - PAP
Adequacy: ABSENT
Comment: NEGATIVE
Diagnosis: NEGATIVE
High risk HPV: NEGATIVE

## 2024-08-11 ENCOUNTER — Other Ambulatory Visit: Payer: Self-pay | Admitting: Student

## 2024-08-11 ENCOUNTER — Ambulatory Visit
Admission: RE | Admit: 2024-08-11 | Discharge: 2024-08-11 | Disposition: A | Discharge status: Home / Self Care | Source: Ambulatory Visit | Attending: Student | Admitting: Student

## 2024-08-11 DIAGNOSIS — R051 Acute cough: Secondary | ICD-10-CM
